# Patient Record
Sex: Male | Born: 1945 | Race: White | Hispanic: No | Marital: Married | Smoking: Never smoker
Health system: Southern US, Community
[De-identification: ages and names within clinical notes are randomized; demographics above are authoritative.]

## PROBLEM LIST (undated history)

## (undated) DIAGNOSIS — J45909 Unspecified asthma, uncomplicated: Secondary | ICD-10-CM

## (undated) DIAGNOSIS — I10 Essential (primary) hypertension: Secondary | ICD-10-CM

## (undated) DIAGNOSIS — C249 Malignant neoplasm of biliary tract, unspecified: Secondary | ICD-10-CM

## (undated) DIAGNOSIS — C221 Intrahepatic bile duct carcinoma: Secondary | ICD-10-CM

---

## 2011-04-02 HISTORY — PX: NASAL POLYP SURGERY: SHX186

## 2014-12-20 HISTORY — PX: BILE DUCT STENT PLACEMENT: SHX1227

## 2015-01-05 ENCOUNTER — Inpatient Hospital Stay (HOSPITAL_COMMUNITY)
Admission: EM | Admit: 2015-01-05 | Discharge: 2015-01-12 | DRG: 871 | Disposition: A | Discharge status: Home Health | Payer: Federal, State, Local not specified - PPO | Attending: Internal Medicine | Admitting: Internal Medicine

## 2015-01-05 ENCOUNTER — Emergency Department (HOSPITAL_COMMUNITY): Payer: Federal, State, Local not specified - PPO

## 2015-01-05 ENCOUNTER — Encounter (HOSPITAL_COMMUNITY): Payer: Self-pay | Admitting: *Deleted

## 2015-01-05 DIAGNOSIS — J45909 Unspecified asthma, uncomplicated: Secondary | ICD-10-CM | POA: Diagnosis present

## 2015-01-05 DIAGNOSIS — D6959 Other secondary thrombocytopenia: Secondary | ICD-10-CM | POA: Diagnosis not present

## 2015-01-05 DIAGNOSIS — T368X5A Adverse effect of other systemic antibiotics, initial encounter: Secondary | ICD-10-CM | POA: Diagnosis not present

## 2015-01-05 DIAGNOSIS — N179 Acute kidney failure, unspecified: Secondary | ICD-10-CM | POA: Diagnosis present

## 2015-01-05 DIAGNOSIS — Z515 Encounter for palliative care: Secondary | ICD-10-CM | POA: Diagnosis not present

## 2015-01-05 DIAGNOSIS — Y92230 Patient room in hospital as the place of occurrence of the external cause: Secondary | ICD-10-CM | POA: Diagnosis not present

## 2015-01-05 DIAGNOSIS — R188 Other ascites: Secondary | ICD-10-CM | POA: Diagnosis present

## 2015-01-05 DIAGNOSIS — A4181 Sepsis due to Enterococcus: Principal | ICD-10-CM | POA: Diagnosis present

## 2015-01-05 DIAGNOSIS — C221 Intrahepatic bile duct carcinoma: Secondary | ICD-10-CM | POA: Diagnosis present

## 2015-01-05 DIAGNOSIS — I10 Essential (primary) hypertension: Secondary | ICD-10-CM | POA: Diagnosis present

## 2015-01-05 DIAGNOSIS — Y95 Nosocomial condition: Secondary | ICD-10-CM | POA: Diagnosis present

## 2015-01-05 DIAGNOSIS — D630 Anemia in neoplastic disease: Secondary | ICD-10-CM | POA: Diagnosis present

## 2015-01-05 DIAGNOSIS — I2699 Other pulmonary embolism without acute cor pulmonale: Secondary | ICD-10-CM | POA: Diagnosis not present

## 2015-01-05 DIAGNOSIS — J9811 Atelectasis: Secondary | ICD-10-CM | POA: Diagnosis present

## 2015-01-05 DIAGNOSIS — D649 Anemia, unspecified: Secondary | ICD-10-CM | POA: Diagnosis present

## 2015-01-05 DIAGNOSIS — Z6826 Body mass index (BMI) 26.0-26.9, adult: Secondary | ICD-10-CM | POA: Diagnosis not present

## 2015-01-05 DIAGNOSIS — R112 Nausea with vomiting, unspecified: Secondary | ICD-10-CM | POA: Diagnosis not present

## 2015-01-05 DIAGNOSIS — Z86711 Personal history of pulmonary embolism: Secondary | ICD-10-CM | POA: Diagnosis not present

## 2015-01-05 DIAGNOSIS — J189 Pneumonia, unspecified organism: Secondary | ICD-10-CM | POA: Diagnosis present

## 2015-01-05 DIAGNOSIS — R7881 Bacteremia: Secondary | ICD-10-CM | POA: Insufficient documentation

## 2015-01-05 DIAGNOSIS — J4520 Mild intermittent asthma, uncomplicated: Secondary | ICD-10-CM | POA: Diagnosis present

## 2015-01-05 DIAGNOSIS — R140 Abdominal distension (gaseous): Secondary | ICD-10-CM

## 2015-01-05 DIAGNOSIS — E43 Unspecified severe protein-calorie malnutrition: Secondary | ICD-10-CM | POA: Diagnosis present

## 2015-01-05 DIAGNOSIS — Z66 Do not resuscitate: Secondary | ICD-10-CM | POA: Diagnosis not present

## 2015-01-05 DIAGNOSIS — K5900 Constipation, unspecified: Secondary | ICD-10-CM | POA: Diagnosis present

## 2015-01-05 DIAGNOSIS — K5909 Other constipation: Secondary | ICD-10-CM | POA: Diagnosis not present

## 2015-01-05 HISTORY — DX: Unspecified asthma, uncomplicated: J45.909

## 2015-01-05 HISTORY — DX: Intrahepatic bile duct carcinoma: C22.1

## 2015-01-05 HISTORY — DX: Essential (primary) hypertension: I10

## 2015-01-05 HISTORY — DX: Malignant neoplasm of biliary tract, unspecified: C24.9

## 2015-01-05 LAB — URINE MICROSCOPIC-ADD ON

## 2015-01-05 LAB — URINALYSIS, ROUTINE W REFLEX MICROSCOPIC
GLUCOSE, UA: NEGATIVE mg/dL
Hgb urine dipstick: NEGATIVE
KETONES UR: NEGATIVE mg/dL
Nitrite: NEGATIVE
PROTEIN: 100 mg/dL — AB
Specific Gravity, Urine: 1.027 (ref 1.005–1.030)
Urobilinogen, UA: 1 mg/dL (ref 0.0–1.0)
pH: 5.5 (ref 5.0–8.0)

## 2015-01-05 LAB — CBC WITH DIFFERENTIAL/PLATELET
Basophils Absolute: 0 10*3/uL (ref 0.0–0.1)
Basophils Relative: 0 %
EOS ABS: 0 10*3/uL (ref 0.0–0.7)
Eosinophils Relative: 0 %
HEMATOCRIT: 28.9 % — AB (ref 39.0–52.0)
HEMOGLOBIN: 9.3 g/dL — AB (ref 13.0–17.0)
LYMPHS ABS: 0.5 10*3/uL — AB (ref 0.7–4.0)
LYMPHS PCT: 3 %
MCH: 29.2 pg (ref 26.0–34.0)
MCHC: 32.2 g/dL (ref 30.0–36.0)
MCV: 90.6 fL (ref 78.0–100.0)
Monocytes Absolute: 0.7 10*3/uL (ref 0.1–1.0)
Monocytes Relative: 4 %
NEUTROS ABS: 15.3 10*3/uL — AB (ref 1.7–7.7)
NEUTROS PCT: 93 %
Platelets: 138 10*3/uL — ABNORMAL LOW (ref 150–400)
RBC: 3.19 MIL/uL — AB (ref 4.22–5.81)
RDW: 15.2 % (ref 11.5–15.5)
WBC: 16.4 10*3/uL — AB (ref 4.0–10.5)

## 2015-01-05 LAB — COMPREHENSIVE METABOLIC PANEL
ALT: 129 U/L — ABNORMAL HIGH (ref 17–63)
ANION GAP: 9 (ref 5–15)
AST: 126 U/L — ABNORMAL HIGH (ref 15–41)
Albumin: 2.4 g/dL — ABNORMAL LOW (ref 3.5–5.0)
Alkaline Phosphatase: 453 U/L — ABNORMAL HIGH (ref 38–126)
BUN: 25 mg/dL — ABNORMAL HIGH (ref 6–20)
CHLORIDE: 107 mmol/L (ref 101–111)
CO2: 19 mmol/L — ABNORMAL LOW (ref 22–32)
Calcium: 7.7 mg/dL — ABNORMAL LOW (ref 8.9–10.3)
Creatinine, Ser: 1.37 mg/dL — ABNORMAL HIGH (ref 0.61–1.24)
GFR, EST AFRICAN AMERICAN: 59 mL/min — AB (ref >60)
GFR, EST NON AFRICAN AMERICAN: 51 mL/min — AB (ref >60)
Glucose, Bld: 136 mg/dL — ABNORMAL HIGH (ref 65–99)
POTASSIUM: 3.9 mmol/L (ref 3.5–5.1)
Sodium: 135 mmol/L (ref 135–145)
Total Bilirubin: 2.1 mg/dL — ABNORMAL HIGH (ref 0.3–1.2)
Total Protein: 6 g/dL — ABNORMAL LOW (ref 6.5–8.1)

## 2015-01-05 LAB — I-STAT CG4 LACTIC ACID, ED
LACTIC ACID, VENOUS: 1.76 mmol/L (ref 0.5–2.0)
Lactic Acid, Venous: 1.01 mmol/L (ref 0.5–2.0)

## 2015-01-05 MED ORDER — DOCUSATE SODIUM 100 MG PO CAPS
100.0000 mg | ORAL_CAPSULE | Freq: Three times a day (TID) | ORAL | Status: DC
  Administered 2015-01-05: 100 mg via ORAL
  Filled 2015-01-05 (×3): qty 1

## 2015-01-05 MED ORDER — FINASTERIDE 1 MG PO TABS: 1.0000 mg | ORAL_TABLET | Freq: Every day | ORAL | Status: DC

## 2015-01-05 MED ORDER — ENOXAPARIN SODIUM 100 MG/ML ~~LOC~~ SOLN
85.0000 mg | Freq: Two times a day (BID) | SUBCUTANEOUS | Status: DC
  Administered 2015-01-05 – 2015-01-12 (×11): 85 mg via SUBCUTANEOUS
  Filled 2015-01-05 (×14): qty 1

## 2015-01-05 MED ORDER — CETYLPYRIDINIUM CHLORIDE 0.05 % MT LIQD
7.0000 mL | Freq: Two times a day (BID) | OROMUCOSAL | Status: DC
  Administered 2015-01-06 – 2015-01-09 (×6): 7 mL via OROMUCOSAL

## 2015-01-05 MED ORDER — MONTELUKAST SODIUM 10 MG PO TABS
10.0000 mg | ORAL_TABLET | Freq: Every day | ORAL | Status: DC
  Administered 2015-01-05 – 2015-01-12 (×8): 10 mg via ORAL
  Filled 2015-01-05 (×8): qty 1

## 2015-01-05 MED ORDER — IPRATROPIUM-ALBUTEROL 0.5-2.5 (3) MG/3ML IN SOLN: 3.0000 mL | Freq: Four times a day (QID) | RESPIRATORY_TRACT | Status: DC | PRN

## 2015-01-05 MED ORDER — MOMETASONE FURO-FORMOTEROL FUM 200-5 MCG/ACT IN AERO
2.0000 | INHALATION_SPRAY | Freq: Two times a day (BID) | RESPIRATORY_TRACT | Status: DC
  Filled 2015-01-05: qty 8.8

## 2015-01-05 MED ORDER — CHLORHEXIDINE GLUCONATE 0.12 % MT SOLN
15.0000 mL | Freq: Two times a day (BID) | OROMUCOSAL | Status: DC
  Administered 2015-01-05 – 2015-01-11 (×8): 15 mL via OROMUCOSAL
  Filled 2015-01-05 (×14): qty 15

## 2015-01-05 MED ORDER — SODIUM CHLORIDE 0.9 % IV BOLUS (SEPSIS)
1000.0000 mL | Freq: Once | INTRAVENOUS | Status: AC
  Administered 2015-01-05: 1000 mL via INTRAVENOUS

## 2015-01-05 MED ORDER — ONDANSETRON HCL 4 MG/2ML IJ SOLN
4.0000 mg | Freq: Four times a day (QID) | INTRAMUSCULAR | Status: DC | PRN
  Administered 2015-01-11: 4 mg via INTRAVENOUS
  Filled 2015-01-05: qty 2

## 2015-01-05 MED ORDER — CLOTRIMAZOLE 1 % EX CREA
1.0000 "application " | TOPICAL_CREAM | Freq: Two times a day (BID) | CUTANEOUS | Status: DC
  Administered 2015-01-05 – 2015-01-11 (×10): 1 via TOPICAL
  Filled 2015-01-05: qty 15

## 2015-01-05 MED ORDER — ONDANSETRON HCL 4 MG PO TABS: 4.0000 mg | ORAL_TABLET | Freq: Four times a day (QID) | ORAL | Status: DC | PRN

## 2015-01-05 MED ORDER — VANCOMYCIN HCL IN DEXTROSE 1-5 GM/200ML-% IV SOLN
1000.0000 mg | Freq: Once | INTRAVENOUS | Status: AC
  Administered 2015-01-05: 1000 mg via INTRAVENOUS
  Filled 2015-01-05: qty 200

## 2015-01-05 MED ORDER — VANCOMYCIN HCL IN DEXTROSE 750-5 MG/150ML-% IV SOLN
750.0000 mg | Freq: Two times a day (BID) | INTRAVENOUS | Status: DC
  Administered 2015-01-06 – 2015-01-08 (×5): 750 mg via INTRAVENOUS
  Filled 2015-01-05 (×6): qty 150

## 2015-01-05 MED ORDER — PIPERACILLIN-TAZOBACTAM 3.375 G IVPB
3.3750 g | Freq: Three times a day (TID) | INTRAVENOUS | Status: DC
  Administered 2015-01-05 – 2015-01-08 (×9): 3.375 g via INTRAVENOUS
  Filled 2015-01-05 (×12): qty 50

## 2015-01-05 MED ORDER — SENNA 8.6 MG PO TABS
2.0000 | ORAL_TABLET | Freq: Every evening | ORAL | Status: DC
  Administered 2015-01-05: 17.2 mg via ORAL
  Filled 2015-01-05 (×2): qty 2

## 2015-01-05 MED ORDER — SODIUM CHLORIDE 0.9 % IJ SOLN
3.0000 mL | Freq: Two times a day (BID) | INTRAMUSCULAR | Status: DC
  Administered 2015-01-05 – 2015-01-11 (×5): 3 mL via INTRAVENOUS

## 2015-01-05 NOTE — ED Notes (Signed)
Bed: ZD63 Expected date:  Expected time:  Means of arrival:  Comments: EMS/61M/f/c/nausea

## 2015-01-05 NOTE — H&P (Signed)
[facility] History and Physical  Kaniel Kiang GQQ:[id number] DOB: Oct 18, 1945 DOA: 01/05/2015  Referring physician: Dorie Rank, MD PCP: Cari Caraway, MD   Chief Complaint: Fever and chills for 2 days  HPI: Terrance Larsen is a 69 y.o. male with a past medical history of asthma, hypertension, bilateral pulmonary embolism, recently diagnosed cholangiocarcinoma who comes to the emergency department with complaints of fever and chills for the last 2 days. The patient denies productive cough, but has increased shortness of breath from baseline. He complains of wheezing, but denies earaches, sore throat or rhinorrhea. He denies any known sick contacts, but has been diagnosed and treated at [facility] in [place] and at [place]. He is supposed to start chemotherapy on Monday.  Patient also complains of severe constipation for the past 3 weeks, which has produced abdominal distention and is impairing his breathing. He has occasionally mild nausea, but denies emesis.   Review of Systems:  Constitutional:  Last 2 days positive for Fever, chills, fatigue.  Positive weight loss, night sweats. HEENT:  No headaches, Difficulty swallowing,Tooth/dental problems,Sore throat,  No sneezing, itching, ear ache, nasal congestion, post nasal drip,  Cardio-vascular:  No chest pain, Orthopnea, PND, swelling in lower extremities, anasarca, dizziness, palpitations  GI:  Positive abdominal pain, nausea, change in bowel habits, loss of appetite.  No heartburn, indigestion, , vomiting, diarrhea Resp:  Positive shortness of breath with exertion or at rest. No excess mucus,  positive non-productive cough, wheezing. No hemoptysis.No change in color of mucus..No chest wall deformity  Skin:  no rash or lesions.  GU:  no dysuria, change in color of urine, no urgency or frequency. No flank pain.  Musculoskeletal:  No joint pain or swelling. No decreased range of motion. No back pain.  Psych:    No change in mood or affect. No depression or anxiety. No memory loss.   Past Medical History  Diagnosis Date  . 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 Hypertension   . Asthma    Past Surgical History  Procedure Laterality Date  . Nasal polyp surgery Bilateral 2013  . Bile duct stent placement  12/20/2014    Liver biopsy was done at this time as well.   Social History:  reports that he has never smoked. He does not have any smokeless tobacco history on file. He reports that he does not drink alcohol or use illicit drugs.  Allergies  Allergen Reactions  . Amlodipine Swelling    'ankles'  . Hctz [Hydrochlorothiazide] Rash    History reviewed. No pertinent family history.    Prior to Admission medications   Medication Sig Start Date End Date Taking? Authorizing Provider  clotrimazole (LOTRIMIN) 1 % cream Apply 1 application topically 2 (two) times daily.   Yes Historical Provider, MD  dexamethasone (DECADRON) 4 MG tablet Take 8 mg by mouth daily as needed. Take for three days after chemo 01/03/15  Yes Historical Provider, MD  docusate sodium (COLACE) 100 MG capsule Take 100 mg by mouth 3 (three) times daily.   Yes Historical Provider, MD  DULERA 200-5 MCG/ACT AERO Inhale 2 puffs into the lungs 2 (two) times daily.  12/06/14  Yes Historical Provider, MD  enoxaparin (LOVENOX) 100 MG/ML injection Inject 0.85 mLs into the skin 2 (two) times daily. For thirty days start 9/19 12/19/14  Yes Historical Provider, MD  finasteride (PROPECIA) 1 MG tablet Take 1 mg by mouth daily.   Yes Historical Provider, MD  omalizumab Arvid Right) 150 MG  injection Inject into the skin every 28 (twenty-eight) days.    Yes Historical Provider, MD  ondansetron (ZOFRAN) 8 MG tablet Take 8 mg by mouth every 8 (eight) hours as needed. For nausea from chemo for three days after treatment 01/03/15  Yes Historical Provider, MD  [place] Starting Chemo 01/09/15 at [facility] .   Yes  Historical Provider, MD  SENNA LAX 8.6 MG tablet Take 2 tablets by mouth every evening. 12/21/14  Yes Historical Provider, MD  montelukast (SINGULAIR) 10 MG tablet Take 10 mg by mouth daily.    Historical Provider, MD   Physical Exam: Filed Vitals:   01/05/15 1800 01/05/15 1824 01/05/15 1900 01/05/15 1907  BP: 113/69 113/70 127/75   Pulse: 99 100 104   Temp:  101.3 F (38.5 C)    TempSrc:  Oral    Resp: 27 24 25    Height:    5\' 10"  (1.778 m)  Weight:    82.101 kg (181 lb)  SpO2: 97% 96% 98%     Wt Readings from Last 3 Encounters:  01/05/15 82.101 kg (181 lb)    General:  Appears calm and comfortable. Eyes: PERRL, normal lids, irises & conjunctiva ENT: grossly normal hearing, lips & tongue are dry Neck: no LAD, masses or thyromegaly Cardiovascular: Sinus tachycardia, no m/r/g. No LE edema. Telemetry: Sinus tachycardia at 106 bpm Respiratory: Right lower lobe Rales, mildly tachypneic at 24 breaths per minute. Abdomen: Distended, bowel sounds positive, soft, right and left lower quadrant tenderness without guarding or rebound tenderness. Skin: In general rash. Musculoskeletal: grossly normal tone BUE/BLE Psychiatric: grossly normal mood and affect, speech fluent and appropriate Neurologic: grossly non-focal.          Labs on Admission:  Basic Metabolic Panel:  Recent Labs Lab 01/05/15 1717  NA 135  K 3.9  CL 107  CO2 19*  GLUCOSE 136*  BUN 25*  CREATININE 1.37*  CALCIUM 7.7*   Liver Function Tests:  Recent Labs Lab 01/05/15 1717  AST 126*  ALT 129*  ALKPHOS 453*  BILITOT 2.1*  PROT 6.0*  ALBUMIN 2.4*   CBC:  Recent Labs Lab 01/05/15 1717  WBC 16.4*  NEUTROABS 15.3*  HGB 9.3*  HCT 28.9*  MCV 90.6  PLT 138*    Radiological Exams on Admission: Dg Chest 2 View  01/05/2015   CLINICAL DATA:  Dry cough and fever for 2 days. Recently diagnosed with cholangiocarcinoma.  EXAM: CHEST  2 VIEW  COMPARISON:  None.  FINDINGS: Study is somewhat  hypoinspiratory with crowding of the perihilar and lower lobe bronchovascular markings. Streaky opacity within the right lower lobe is of uncertain etiology, probably atelectasis but possibly an early developing pneumonia.  No pleural effusion seen. No pneumothorax. Heart size is normal. No significant osseous abnormality seen.  IMPRESSION: 1. Streaky opacity within the right lower lung is of uncertain etiology or significance, probably atelectasis but early developing pneumonia cannot be excluded. Recommend follow-up chest x-ray in 2-4 weeks to ensure resolution. 2. Otherwise unremarkable chest x-ray, as detailed above.   Electronically Signed   By: Franki Cabot M.D.   On: 01/05/2015 17:37   Dg Abd 1 View  01/05/2015   CLINICAL DATA:  Pt complains of dry cough and fever x 2 days. Pt was recently diagnosed with cholangiocarcinoma. Hx of HTN. Pt also complains of constipation and abdominal distension x 2 weeks.  EXAM: ABDOMEN - 1 VIEW  COMPARISON:  None.  FINDINGS: Plastic biliary stent projects in  expected location. Retrievable IVC filter at the L2-3 level. Multiple gas filled nondilated small bowel loops in the mid abdomen. The colon and rectum are nondilated.  No abnormal abdominal calcifications.  Subchondral sclerosis in the right acetabulum.  IMPRESSION: 1. Mild small bowel gaseous distension without evidence of obstruction. 2. Biliary stent an IVC filter.   Electronically Signed   By: Lucrezia Europe M.D.   On: 01/05/2015 17:37    Assessment/Plan Principal Problem:     HCAP (healthcare-associated pneumonia) Admit to telemetry for cardiac monitoring. Continue supplemental oxygen. Continue IV vancomycin and Zosyn. Since the patient is already on Lovenox, monitor platelets closely while on Zosyn. DuoNeb as needed. Follow-up blood cultures and sensitivity. Obtain legionella and strep pneumonia urine antigens. Check sputum Gram stain, culture and sensitivity.  Active Problems:     Anemia Monitor H&H  closely.      Bilateral pulmonary embolism (HCC) Continue Lovenox subcutaneously 1 mg/kg twice a day.      Cholangiocarcinoma [facility]) The patient most likely will need to be rescheduled for his chemotherapy that was supposed to be started on Monday.       Asthma Continue current home medications. DuoNeb as needed.      Hypertension Not on any antihypertensives at the moment.  Monitor blood pressure      Constipation The patient has tried multiple oral medications without any significant results. Agreed to try to tap water enema.   Code Status: Full code. DVT Prophylaxis: On full dose Lovenox. Family CommunicationTennis, [place] [phone]  [phone]  Disposition Plan: Admit for IV antibiotic therapy.  Time spent: 70 minutes were spent during the process of this admission.  Reubin Milan [facility] Pager [phone].

## 2015-01-05 NOTE — ED Notes (Signed)
Per EMS, pt from home, recently dx with cholangeo carcinoma x 2 weeks ago.  Reports fever and chills and nausea x 2 days.  Starts chemo Monday.  Pt is A&O x4.

## 2015-01-05 NOTE — Progress Notes (Signed)
ANTIBIOTIC CONSULT NOTE - INITIAL  Pharmacy Consult for Vancomycin Indication: pneumonia  Allergies  Allergen Reactions  . Amlodipine Swelling    'ankles'  . Hctz [Hydrochlorothiazide] Rash    Patient Measurements:     Vital Signs: Temp: 101.3 F (38.5 C) (10/06 1824) Temp Source: Oral (10/06 1824) BP: 113/70 mmHg (10/06 1824) Pulse Rate: 100 (10/06 1824) Intake/Output from previous day:   Intake/Output from this shift:    Labs:  Recent Labs  01/05/15 1717  WBC 16.4*  HGB 9.3*  PLT 138*  CREATININE 1.37*   CrCl cannot be calculated (Unknown ideal weight.). No results for input(s): VANCOTROUGH, VANCOPEAK, VANCORANDOM, GENTTROUGH, GENTPEAK, GENTRANDOM, TOBRATROUGH, TOBRAPEAK, TOBRARND, AMIKACINPEAK, AMIKACINTROU, AMIKACIN in the last 72 hours.   Microbiology: No results found for this or any previous visit (from the past 720 hour(s)).  Medical History: Past Medical History  Diagnosis Date  . 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 Hypertension   . Asthma      Assessment: 55 yoM recently diagnosed with cholangiocarcinoma presents with recurrent fevers.  CXR shows possibility of pneumonia.  Vancomycin and Zosyn started.  Vancomycin 1g x 1 ordered while waiting for patient weight.  Blood and urine cultures ordered.  Tmax 101.3 WBC 16.4 SCr 1.37, CrCl~58 ml/min  Goal of Therapy:  Vancomycin trough level 15-20 mcg/ml  Doses adjusted per renal function Eradication of infection  Plan:  1.  Vancomycin 750 mg IV q12h. 2.  Continue Zosyn 3.375g IV q8h (4 hour infusion time).   Terrance Larsen 01/05/2015,7:02 PM

## 2015-01-05 NOTE — ED Notes (Signed)
Hospitalist at the bedside 

## 2015-01-05 NOTE — ED Provider Notes (Signed)
CSN: [id number]     Arrival date & time 01/05/15  1605 History   First MD Initiated Contact with Patient 01/05/15 1614     Chief Complaint  Patient presents with  . Fever  . Nausea    HPI Comments: Pt was diagnosed with cholangiocarcinoma 2 weeks ago.  Initially he had a DVT.  Pt developed jaundice and that is how it was diagnosed.  He has an IVC filter and a biliary drain.   This was at [facility]).  Patient is a 69 y.o. male presenting with fever. The history is provided by the patient.  Fever Max temp prior to arrival:  101 Severity:  Moderate Timing:  Intermittent Chronicity:  New Associated symptoms: no cough, no dysuria and no vomiting   Associated symptoms comment:  Pt has been consitpated.  He has not had a normal bowel movement in a couple of weeks.  He did have a small BM yesterday. Risk factors: hx of cancer     Past Medical History  Diagnosis Date  . 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 Hypertension   . Asthma    History reviewed. No pertinent past surgical history. No family history on file. Social History  Substance Use Topics  . Smoking status: Never Smoker   . Smokeless tobacco: None  . Alcohol Use: No    Review of Systems  Constitutional: Positive for fever.  Respiratory: Negative for cough.   Gastrointestinal: Negative for vomiting.  Genitourinary: Negative for dysuria.  All other systems reviewed and are negative.     Allergies  Hctz  Home Medications   Prior to Admission medications   Not on File   BP 143/84 mmHg  Pulse 118  Temp(Src) 99 F (37.2 C)  Resp 17  SpO2 97% Physical Exam  Constitutional: No distress.  HENT:  Head: Normocephalic and atraumatic.  Right Ear: External ear normal.  Left Ear: External ear normal.  Eyes: Conjunctivae are normal. Right eye exhibits no discharge. Left eye exhibits no discharge. No scleral icterus.  Neck: Neck supple. No tracheal deviation present.  Cardiovascular:  Normal rate, regular rhythm and intact distal pulses.   Pulmonary/Chest: Effort normal and breath sounds normal. No stridor. No respiratory distress. He has no wheezes. He has no rales.  Abdominal: Soft. Bowel sounds are normal. He exhibits no distension. There is no tenderness. There is no rebound and no guarding.  Musculoskeletal: He exhibits no edema or tenderness.  Neurological: He is alert. He has normal strength. No cranial nerve deficit (no facial droop, extraocular movements intact, no slurred speech) or sensory deficit. He exhibits normal muscle tone. He displays no seizure activity. Coordination normal.  Skin: Skin is warm and dry. No rash noted.  Psychiatric: He has a normal mood and affect.  Nursing note and vitals reviewed.   ED Course  Procedures (including critical care time) Labs Review Labs Reviewed  COMPREHENSIVE METABOLIC PANEL - Abnormal; Notable for the following:    CO2 19 (*)    Glucose, Bld 136 (*)    BUN 25 (*)    Creatinine, Ser 1.37 (*)    Calcium 7.7 (*)    Total Protein 6.0 (*)    Albumin 2.4 (*)    AST 126 (*)    ALT 129 (*)    Alkaline Phosphatase 453 (*)    Total Bilirubin 2.1 (*)    GFR calc non Af Amer 51 (*)    GFR calc Af Amer 59 (*)  All other components within normal limits  CBC WITH DIFFERENTIAL/PLATELET - Abnormal; Notable for the following:    WBC 16.4 (*)    RBC 3.19 (*)    Hemoglobin 9.3 (*)    HCT 28.9 (*)    Platelets 138 (*)    Neutro Abs 15.3 (*)    Lymphs Abs 0.5 (*)    All other components within normal limits  CULTURE, BLOOD (ROUTINE X 2)  CULTURE, BLOOD (ROUTINE X 2)  URINE CULTURE  URINALYSIS, ROUTINE W REFLEX MICROSCOPIC (NOT AT [facility])  I-STAT CG4 LACTIC ACID, ED    Imaging Review Dg Chest 2 View  01/05/2015   CLINICAL DATA:  Dry cough and fever for 2 days. Recently diagnosed with cholangiocarcinoma.  EXAM: CHEST  2 VIEW  COMPARISON:  None.  FINDINGS: Study is somewhat hypoinspiratory with crowding of the perihilar  and lower lobe bronchovascular markings. Streaky opacity within the right lower lobe is of uncertain etiology, probably atelectasis but possibly an early developing pneumonia.  No pleural effusion seen. No pneumothorax. Heart size is normal. No significant osseous abnormality seen.  IMPRESSION: 1. Streaky opacity within the right lower lung is of uncertain etiology or significance, probably atelectasis but early developing pneumonia cannot be excluded. Recommend follow-up chest x-ray in 2-4 weeks to ensure resolution. 2. Otherwise unremarkable chest x-ray, as detailed above.   Electronically Signed   By: Franki Cabot M.D.   On: 01/05/2015 17:37   Dg Abd 1 View  01/05/2015   CLINICAL DATA:  Pt complains of dry cough and fever x 2 days. Pt was recently diagnosed with cholangiocarcinoma. Hx of HTN. Pt also complains of constipation and abdominal distension x 2 weeks.  EXAM: ABDOMEN - 1 VIEW  COMPARISON:  None.  FINDINGS: Plastic biliary stent projects in expected location. Retrievable IVC filter at the L2-3 level. Multiple gas filled nondilated small bowel loops in the mid abdomen. The colon and rectum are nondilated.  No abnormal abdominal calcifications.  Subchondral sclerosis in the right acetabulum.  IMPRESSION: 1. Mild small bowel gaseous distension without evidence of obstruction. 2. Biliary stent an IVC filter.   Electronically Signed   By: Lucrezia Europe M.D.   On: 01/05/2015 17:37   I have personally reviewed and evaluated these images and lab results as part of my medical decision-making.   MDM   Final diagnoses:  HCAP (healthcare-associated pneumonia)  Cholangiocarcinoma [facility])   Patient presents to the emergency room with recurrent fevers. Patient was recently diagnosed with cholangiocarcinoma. He was in the hospital at a medical facility in [place]. The patient is planning on receiving treatment at [facility].    Patient is having fevers up to 101 here in the emergency department.  Laboratory tests show leukocytosis as well as persistent elevations in his bilirubin and increase in his BUN and creatinine. Do not have any old labs for comparison. Chest x-ray suggests the possibility of pneumonia.  Start the patient on antibiotics to cover for healthcare associated pneumonia. I will consult the medical service for admission and further treatment.     Dorie Rank, MD 01/05/15 Lurline Hare

## 2015-01-05 NOTE — Progress Notes (Signed)
Patient listed as not having insurance or a pcp.  EDCM spoke to patient at bedside.  Patient confirms he has insurance.  Patient reports he has a new pcp Dr. Theadore Nan with [facility], but he has not seen her yet.  System updated.

## 2015-01-06 DIAGNOSIS — I10 Essential (primary) hypertension: Secondary | ICD-10-CM

## 2015-01-06 DIAGNOSIS — R6510 Systemic inflammatory response syndrome (SIRS) of non-infectious origin without acute organ dysfunction: Secondary | ICD-10-CM

## 2015-01-06 DIAGNOSIS — E43 Unspecified severe protein-calorie malnutrition: Secondary | ICD-10-CM

## 2015-01-06 DIAGNOSIS — J4520 Mild intermittent asthma, uncomplicated: Secondary | ICD-10-CM

## 2015-01-06 LAB — COMPREHENSIVE METABOLIC PANEL
ALBUMIN: 2.1 g/dL — AB (ref 3.5–5.0)
ALT: 116 U/L — ABNORMAL HIGH (ref 17–63)
ANION GAP: 6 (ref 5–15)
AST: 114 U/L — AB (ref 15–41)
Alkaline Phosphatase: 368 U/L — ABNORMAL HIGH (ref 38–126)
BUN: 24 mg/dL — AB (ref 6–20)
CHLORIDE: 109 mmol/L (ref 101–111)
CO2: 21 mmol/L — ABNORMAL LOW (ref 22–32)
Calcium: 7.7 mg/dL — ABNORMAL LOW (ref 8.9–10.3)
Creatinine, Ser: 1.39 mg/dL — ABNORMAL HIGH (ref 0.61–1.24)
GFR calc Af Amer: 58 mL/min — ABNORMAL LOW (ref >60)
GFR, EST NON AFRICAN AMERICAN: 50 mL/min — AB (ref >60)
GLUCOSE: 117 mg/dL — AB (ref 65–99)
POTASSIUM: 3.7 mmol/L (ref 3.5–5.1)
Sodium: 136 mmol/L (ref 135–145)
Total Bilirubin: 2 mg/dL — ABNORMAL HIGH (ref 0.3–1.2)
Total Protein: 5.7 g/dL — ABNORMAL LOW (ref 6.5–8.1)

## 2015-01-06 LAB — CBC WITH DIFFERENTIAL/PLATELET
BASOS ABS: 0 10*3/uL (ref 0.0–0.1)
Basophils Relative: 0 %
EOS PCT: 0 %
Eosinophils Absolute: 0 10*3/uL (ref 0.0–0.7)
HEMATOCRIT: 26 % — AB (ref 39.0–52.0)
HEMOGLOBIN: 8.3 g/dL — AB (ref 13.0–17.0)
LYMPHS ABS: 0.9 10*3/uL (ref 0.7–4.0)
LYMPHS PCT: 7 %
MCH: 28.9 pg (ref 26.0–34.0)
MCHC: 31.9 g/dL (ref 30.0–36.0)
MCV: 90.6 fL (ref 78.0–100.0)
Monocytes Absolute: 1.7 10*3/uL — ABNORMAL HIGH (ref 0.1–1.0)
Monocytes Relative: 14 %
NEUTROS ABS: 9.8 10*3/uL — AB (ref 1.7–7.7)
Neutrophils Relative %: 78 %
PLATELETS: 120 10*3/uL — AB (ref 150–400)
RBC: 2.87 MIL/uL — AB (ref 4.22–5.81)
RDW: 15.1 % (ref 11.5–15.5)
WBC: 12.5 10*3/uL — AB (ref 4.0–10.5)

## 2015-01-06 LAB — STREP PNEUMONIAE URINARY ANTIGEN: STREP PNEUMO URINARY ANTIGEN: NEGATIVE

## 2015-01-06 LAB — URINE CULTURE: Culture: NO GROWTH

## 2015-01-06 MED ORDER — POLYETHYLENE GLYCOL 3350 17 G PO PACK
17.0000 g | PACK | Freq: Two times a day (BID) | ORAL | Status: DC
  Administered 2015-01-06: 17 g via ORAL
  Filled 2015-01-06 (×6): qty 1

## 2015-01-06 MED ORDER — SODIUM CHLORIDE 0.9 % IV SOLN
INTRAVENOUS | Status: DC
  Administered 2015-01-06: 23:00:00 via INTRAVENOUS

## 2015-01-06 MED ORDER — BUDESONIDE 0.25 MG/2ML IN SUSP
0.2500 mg | Freq: Two times a day (BID) | RESPIRATORY_TRACT | Status: DC
  Administered 2015-01-06 – 2015-01-12 (×13): 0.25 mg via RESPIRATORY_TRACT
  Filled 2015-01-06 (×13): qty 2

## 2015-01-06 MED ORDER — DOCUSATE SODIUM 100 MG PO CAPS
100.0000 mg | ORAL_CAPSULE | Freq: Two times a day (BID) | ORAL | Status: DC
  Administered 2015-01-06 (×2): 100 mg via ORAL
  Filled 2015-01-06 (×2): qty 1

## 2015-01-06 NOTE — Care Management Note (Signed)
Case Management Note  Patient Details  Name: Terrance Larsen MRN: [id number] Date of Birth: 1945-05-09  Subjective/Objective: 69 y.o. M admitted with recurrent fevers following recent diagnosis of Cholangiocarcinoma. Fever, Chills, T 101.3. Lives with spouse in Private residence. Currently on IV Abx.                    Action/Plan:Will continue to follow for Disposition and any Discharge needs.   Expected Discharge Date:   (unknown)               Expected Discharge Plan:  [place]  In-House Referral:     Discharge planning Services  CM Consult  Post Acute Care Choice:    Choice offered to:     DME Arranged:    DME Agency:     HH Arranged:    HH Agency:     Status of Service:  In process, will continue to follow  Medicare Important Message Given:    Date Medicare IM Given:    Medicare IM give by:    Date Additional Medicare IM Given:    Additional Medicare Important Message give by:     If discussed at [place] of Stay Meetings, dates discussed:    Additional Comments:  Delrae Sawyers, RN 01/06/2015, 2:02 PM

## 2015-01-06 NOTE — Progress Notes (Signed)
Lab reported second set of blood cultures grew gram negative rods. MD notified on floor. Already on antibiotic coverage.  Kathryne Sharper, RN

## 2015-01-06 NOTE — Progress Notes (Signed)
Initial Nutrition Assessment  DOCUMENTATION CODES:   Severe malnutrition in context of acute illness/injury  INTERVENTION:  - Recommended to pt to drink fluids between meals, not with meals - RD will continue to monitor for needs, including needs for supplements  NUTRITION DIAGNOSIS:   Inadequate oral intake related to acute illness, altered GI function as evidenced by per patient/family report, meal completion < 25%.  GOAL:   Patient will meet greater than or equal to 90% of their needs  MONITOR:   PO intake, Weight trends, Labs, I & O's  REASON FOR ASSESSMENT:   Malnutrition Screening Tool  ASSESSMENT:   69 y.o. male with a past medical history of asthma, hypertension, bilateral pulmonary embolism, recently diagnosed cholangiocarcinoma who comes to the emergency department with complaints of fever and chills for the last 2 days. The patient denies productive cough, but has increased shortness of breath from baseline. He complains of wheezing, but denies earaches, sore throat or rhinorrhea. He denies any known sick contacts, but has been diagnosed and treated at [facility] in [place] and at [place]. He is supposed to start chemotherapy on Monday. (01/09/15)  Pt seen for MST. BMI indicates over weight status. Pt reports that for the past 3 weeks he has felt very constipated and very full. Due to this, he has only been able to take a few bites at meals. Even these few bites make this full feeling more pronounced but does not cause nausea. He states that it is not the type of food, but the quantity, that causes this sensation.  Noted that pt had have consumed iced tea on lunch tray. Pt states that he often takes several sips of fluids with each bite of food. Encouraged him to drink fluids between meals rather than with meals to encourage greater intakes of solid foods. Pt states that this makes sense to him and he will try to do this with subsequent meals.  Physical  assessment not performed during this assessment but will be done on follow-up. Pt reports 10-15 lb weight loss over the past 3 weeks; he states no weight loss prior to this time frame. This indicates 5-7.5% body weight in 3 weeks which is significant for time frame.  Not meeting needs. Will continue to monitor for needs and recommendations. Medications reviewed. Labs reviewed; BUN/creatinine elevated, Ca: 7.7 mg/dL, GFR: 50.   Diet Order:  Diet Heart Room service appropriate?: Yes; Fluid consistency:: Thin  Skin:  Reviewed, no issues  Last BM:  10/6  Height:   Ht Readings from Last 1 Encounters:  01/05/15 5\' 10"  (1.778 m)    Weight:   Wt Readings from Last 1 Encounters:  01/05/15 185 lb 3 oz (84 kg)    Ideal Body Weight:  75.45 kg (kg)  BMI:  Body mass index is 26.57 kg/(m^2).  Estimated Nutritional Needs:   Kcal:  2350-2500  Protein:  85-95 grams  Fluid:  1.8-2 L/day  EDUCATION NEEDS:   No education needs identified at this time     Jarome Matin, RD, LDN Inpatient Clinical Dietitian Pager # [phone] After hours/weekend pager # [phone]

## 2015-01-06 NOTE — Progress Notes (Addendum)
[facility] PROGRESS NOTE  Terrance Larsen NFA:[id number] DOB: Aug 27, 1945 DOA: 01/05/2015 PCP: Cari Caraway, MD  Assessment/Plan: 1-SIRS/early sepsis: due to Gram neg rods bacteremia vs HCAP -patient on presentation with RR of 27, HR 107, temp 101.9 and WBC's 16.4 -positive blood cx's for gram neg rods 2/2 -CXR with concerns for early PNA -patient has been started on broad spectrum antibiotics -will follow cultures results -continue supportive care -follow clinical response  -will start flutter valve  2-hx of bilateral PE -continue lovenox -follow Hgb trend  3-cholangiocarcinoma: s/p biliary drain -plan is for initiation of chemotherapy at [facility] after current infection is clear -discussed with patient's primary oncologist -will monitor LLFT's  4-hx of asthma: mild intermittent and no complicated -will continue nebulizer treatment -no wheezing currently  5-hx of HTN: diet controlled -BP is stable -will monitor  6-constipation: will use miralax and colace -follow response and use PRN enema if needed  7-AKI: no prior records for comparison -assume secondary to pre-renal azotemia -will give IVF's resuscitation -follow renal function -no signs of UTI on UA  8-severe protein calorie malnutrition: in setting of acute illness/malignancy -will follow nutritional service recommendations for feeding supplements   Code Status: Full code Family Communication: wife at bedside Disposition Plan: remains inpatient, continue IV antibiotics, follow cultures speciation and sensitivity.    Consultants:  Discussed with his primary Oncologist at [facility].  Procedures:  See below for x-ray reports   Antibiotics:  Vanc/zosyn 10/06  HPI/Subjective: Positive low grade fever, no CP. Patient reports no SOB and endorses he is feeling better. Positive gram neg rods blood cx's times 2  Objective: Filed Vitals:   01/06/15 2036  BP: 111/73  Pulse: 93  Temp: 98.9 F (37.2  C)  Resp: 16    Intake/Output Summary (Last 24 hours) at 01/06/15 2237 Last data filed at 01/06/15 2229  Gross per 24 hour  Intake   1040 ml  Output      0 ml  Net   1040 ml   Filed Weights   01/05/15 1907 01/05/15 2150  Weight: 82.101 kg (181 lb) 84 kg (185 lb 3 oz)    Exam:   General:  Low grade temp, no CP, breathing better and denying abd pain, nausea or vomiting. No icterus on exam.  Cardiovascular: S1 and S2, no rubs, no gallops, no JVD  Respiratory: scattered rhonchi, mild exp wheezing, no rales  Abdomen: soft, no guarding, positive BS  Musculoskeletal: no edema, no cyanosis  Data Reviewed: Basic Metabolic Panel:  Recent Labs Lab 01/05/15 1717 01/06/15 0555  NA 135 136  K 3.9 3.7  CL 107 109  CO2 19* 21*  GLUCOSE 136* 117*  BUN 25* 24*  CREATININE 1.37* 1.39*  CALCIUM 7.7* 7.7*   Liver Function Tests:  Recent Labs Lab 01/05/15 1717 01/06/15 0555  AST 126* 114*  ALT 129* 116*  ALKPHOS 453* 368*  BILITOT 2.1* 2.0*  PROT 6.0* 5.7*  ALBUMIN 2.4* 2.1*   CBC:  Recent Labs Lab 01/05/15 1717 01/06/15 0555  WBC 16.4* 12.5*  NEUTROABS 15.3* 9.8*  HGB 9.3* 8.3*  HCT 28.9* 26.0*  MCV 90.6 90.6  PLT 138* 120*   BNP (last 3 results) No results for input(s): BNP in the last 8760 hours.  ProBNP (last 3 results) No results for input(s): PROBNP in the last 8760 hours.  CBG: No results for input(s): GLUCAP in the last 168 hours.  Recent Results (from the past 240 hour(s))  Blood Culture (routine x 2)  Status: None (Preliminary result)   Collection Time: 01/05/15  5:05 PM  Result Value Ref Range Status   Specimen Description BLOOD LEFT ANTECUBITAL  Final   Special Requests BOTTLES DRAWN AEROBIC AND ANAEROBIC 5CC  Final   Culture  Setup Time   Final    GRAM NEGATIVE RODS IN BOTH AEROBIC AND ANAEROBIC BOTTLES CRITICAL RESULT CALLED TO, READ BACK BY AND VERIFIED WITH: LIRA @0635  01/06/15 MKELLY    Culture   Final    NO GROWTH < 24  HOURS Performed at [facility]    Report Status PENDING  Incomplete  Blood Culture (routine x 2)     Status: None (Preliminary result)   Collection Time: 01/05/15  5:48 PM  Result Value Ref Range Status   Specimen Description BLOOD LEFT FOREARM  Final   Special Requests BOTTLES DRAWN AEROBIC AND ANAEROBIC 5CC  Final   Culture  Setup Time   Final    GRAM NEGATIVE RODS IN BOTH AEROBIC AND ANAEROBIC BOTTLES CRITICAL RESULT CALLED TO, READ BACK BY AND VERIFIED WITH: K PHILLIPS,RN AT 0946 01/06/15 BY L BENFIELD    Culture   Final    GRAM NEGATIVE RODS Performed at [facility]    Report Status PENDING  Incomplete  Urine culture     Status: None   Collection Time: 01/05/15  7:02 PM  Result Value Ref Range Status   Specimen Description URINE, CLEAN CATCH  Final   Special Requests NONE  Final   Culture   Final    NO GROWTH 1 DAY Performed at [facility]    Report Status 01/06/2015 FINAL  Final     Studies: Dg Chest 2 View  01/05/2015   CLINICAL DATA:  Dry cough and fever for 2 days. Recently diagnosed with cholangiocarcinoma.  EXAM: CHEST  2 VIEW  COMPARISON:  None.  FINDINGS: Study is somewhat hypoinspiratory with crowding of the perihilar and lower lobe bronchovascular markings. Streaky opacity within the right lower lobe is of uncertain etiology, probably atelectasis but possibly an early developing pneumonia.  No pleural effusion seen. No pneumothorax. Heart size is normal. No significant osseous abnormality seen.  IMPRESSION: 1. Streaky opacity within the right lower lung is of uncertain etiology or significance, probably atelectasis but early developing pneumonia cannot be excluded. Recommend follow-up chest x-ray in 2-4 weeks to ensure resolution. 2. Otherwise unremarkable chest x-ray, as detailed above.   Electronically Signed   By: Franki Cabot M.D.   On: 01/05/2015 17:37   Dg Abd 1 View  01/05/2015   CLINICAL DATA:  Pt complains of dry cough and fever x 2  days. Pt was recently diagnosed with cholangiocarcinoma. Hx of HTN. Pt also complains of constipation and abdominal distension x 2 weeks.  EXAM: ABDOMEN - 1 VIEW  COMPARISON:  None.  FINDINGS: Plastic biliary stent projects in expected location. Retrievable IVC filter at the L2-3 level. Multiple gas filled nondilated small bowel loops in the mid abdomen. The colon and rectum are nondilated.  No abnormal abdominal calcifications.  Subchondral sclerosis in the right acetabulum.  IMPRESSION: 1. Mild small bowel gaseous distension without evidence of obstruction. 2. Biliary stent an IVC filter.   Electronically Signed   By: Lucrezia Europe M.D.   On: 01/05/2015 17:37    Scheduled Meds: . antiseptic oral rinse  7 mL Mouth Rinse q12n4p  . budesonide (PULMICORT) nebulizer solution  0.25 mg Nebulization BID  . chlorhexidine  15 mL Mouth Rinse BID  .  clotrimazole  1 application Topical BID  . docusate sodium  100 mg Oral BID  . enoxaparin  85 mg Subcutaneous BID  . finasteride  1 mg Oral Daily  . montelukast  10 mg Oral Daily  . piperacillin-tazobactam  3.375 g Intravenous 3 times per day  . polyethylene glycol  17 g Oral BID  . sodium chloride  3 mL Intravenous Q12H  . vancomycin  750 mg Intravenous Q12H   Continuous Infusions:   Principal Problem:   HCAP (healthcare-associated pneumonia) Active Problems:   Anemia   Bilateral pulmonary embolism ([place])   Cholangiocarcinoma (HCC)   Asthma   Hypertension   Protein-calorie malnutrition, severe    Time spent: 35 minutes    Barton Dubois  [facility] Pager [phone]. If 7PM-7AM, please contact night-coverage at [email or web address], password [facility] 01/06/2015, 10:37 PM  LOS: 1 day

## 2015-01-07 DIAGNOSIS — N179 Acute kidney failure, unspecified: Secondary | ICD-10-CM | POA: Insufficient documentation

## 2015-01-07 DIAGNOSIS — K5909 Other constipation: Secondary | ICD-10-CM

## 2015-01-07 DIAGNOSIS — R7881 Bacteremia: Secondary | ICD-10-CM | POA: Insufficient documentation

## 2015-01-07 DIAGNOSIS — K5900 Constipation, unspecified: Secondary | ICD-10-CM

## 2015-01-07 DIAGNOSIS — Z515 Encounter for palliative care: Secondary | ICD-10-CM

## 2015-01-07 LAB — BASIC METABOLIC PANEL
Anion gap: 11 (ref 5–15)
BUN: 28 mg/dL — ABNORMAL HIGH (ref 6–20)
CALCIUM: 7.6 mg/dL — AB (ref 8.9–10.3)
CHLORIDE: 105 mmol/L (ref 101–111)
CO2: 21 mmol/L — ABNORMAL LOW (ref 22–32)
CREATININE: 1.51 mg/dL — AB (ref 0.61–1.24)
GFR, EST AFRICAN AMERICAN: 53 mL/min — AB (ref >60)
GFR, EST NON AFRICAN AMERICAN: 45 mL/min — AB (ref >60)
Glucose, Bld: 85 mg/dL (ref 65–99)
Potassium: 3.7 mmol/L (ref 3.5–5.1)
SODIUM: 137 mmol/L (ref 135–145)

## 2015-01-07 LAB — LEGIONELLA PNEUMOPHILA SEROGP 1 UR AG: L. PNEUMOPHILA SEROGP 1 UR AG: NEGATIVE

## 2015-01-07 LAB — CBC
HCT: 24.9 % — ABNORMAL LOW (ref 39.0–52.0)
HEMOGLOBIN: 8 g/dL — AB (ref 13.0–17.0)
MCH: 29.2 pg (ref 26.0–34.0)
MCHC: 32.1 g/dL (ref 30.0–36.0)
MCV: 90.9 fL (ref 78.0–100.0)
PLATELETS: 149 10*3/uL — AB (ref 150–400)
RBC: 2.74 MIL/uL — ABNORMAL LOW (ref 4.22–5.81)
RDW: 15.5 % (ref 11.5–15.5)
WBC: 10.2 10*3/uL (ref 4.0–10.5)

## 2015-01-07 LAB — VANCOMYCIN, TROUGH: VANCOMYCIN TR: 17 ug/mL (ref 10.0–20.0)

## 2015-01-07 MED ORDER — SIMETHICONE 40 MG/0.6ML PO SUSP
40.0000 mg | Freq: Four times a day (QID) | ORAL | Status: DC | PRN
  Administered 2015-01-07: 40 mg via ORAL
  Filled 2015-01-07 (×2): qty 0.6

## 2015-01-07 MED ORDER — SENNOSIDES-DOCUSATE SODIUM 8.6-50 MG PO TABS: 1.0000 | ORAL_TABLET | Freq: Two times a day (BID) | ORAL | Status: DC

## 2015-01-07 MED ORDER — METOCLOPRAMIDE HCL 5 MG/ML IJ SOLN
5.0000 mg | Freq: Three times a day (TID) | INTRAMUSCULAR | Status: DC
  Administered 2015-01-07 – 2015-01-08 (×5): 5 mg via INTRAVENOUS
  Filled 2015-01-07 (×3): qty 1
  Filled 2015-01-07: qty 2
  Filled 2015-01-07 (×2): qty 1
  Filled 2015-01-07: qty 2
  Filled 2015-01-07 (×10): qty 1
  Filled 2015-01-07: qty 2

## 2015-01-07 MED ORDER — SODIUM CHLORIDE 0.9 % IV SOLN
INTRAVENOUS | Status: DC
  Administered 2015-01-07 – 2015-01-10 (×5): via INTRAVENOUS

## 2015-01-07 MED ORDER — SORBITOL 70 % SOLN
30.0000 mL | Freq: Two times a day (BID) | Status: DC | PRN
  Administered 2015-01-07: 30 mL via ORAL
  Filled 2015-01-07 (×2): qty 30

## 2015-01-07 MED ORDER — MILK AND MOLASSES ENEMA
1.0000 | Freq: Once | RECTAL | Status: AC
  Administered 2015-01-07: 250 mL via RECTAL
  Filled 2015-01-07: qty 250

## 2015-01-07 MED ORDER — MILK AND MOLASSES ENEMA
1.0000 | Freq: Once | RECTAL | Status: DC
Start: 2015-01-07 — End: 2015-01-07

## 2015-01-07 NOTE — Progress Notes (Signed)
[facility] PROGRESS NOTE  Terrance Larsen YIR:[id number] DOB: 1945-06-10 DOA: 01/05/2015 PCP: Cari Caraway, MD  Assessment/Plan: 1-SIRS/early sepsis: due to Gram neg rods bacteremia vs HCAP -patient on presentation with RR of 27, HR 107, temp 101.9 and WBC's 16.4 -positive blood cx's for gram neg rods 2/2 -CXR with concerns for early PNA, but no coughing and per patient impairment in his breathing due to distended abdomen -will continue broad spectrum antibiotics; looking to discontinue vanc in the next 24-48 hours -will follow final cultures results -continue supportive care -follow clinical response  -will continue flutter valve and pulmonary toiletry -no further fever and WBC's WNL now    2-hx of bilateral PE -continue lovenox -follow Hgb trend  3-cholangiocarcinoma: s/p biliary stent -plan is for initiation of palliative chemotherapy at [facility] after current infection is clear -discussed with patient's primary oncologist -will monitor LFT's  4-hx of asthma: mild intermittent and no complicated -will continue nebulizer treatment -no wheezing currently on exam  5-hx of HTN: diet controlled -BP is stable -will monitor VS  6-constipation:  -will use sorbitol and simethicone -will also add reglan to help with motility -will ask palliative care to assist with symptoms management and will follow rec's  7-AKI: no prior records for comparison -assume secondary to pre-renal azotemia -will continue gentle IVF's resuscitation -follow renal function -no signs of UTI on UA  8-severe protein calorie malnutrition: in setting of acute illness/malignancy -will follow nutritional service recommendations for feeding supplements   Code Status: DNR Family Communication: wife at bedside Disposition Plan: remains inpatient, continue IV antibiotics, follow cultures speciation and sensitivity. Will follow [place] meeting and addressed constipation    Consultants:  Discussed with  his primary Oncologist at [facility] (Dr Rushie Nyhan)  Palliative Care  Procedures:  See below for x-ray reports   Antibiotics:  Vanc/zosyn 10/06  HPI/Subjective: No further fever, improved in his WBC's, no CP. Main complaint is abd distension, no BM and early satiety   Objective: Filed Vitals:   01/07/15 1414  BP: 118/77  Pulse: 86  Temp: 98 F (36.7 C)  Resp: 16    Intake/Output Summary (Last 24 hours) at 01/07/15 1811 Last data filed at 01/07/15 1445  Gross per 24 hour  Intake 2157.92 ml  Output      0 ml  Net 2157.92 ml   Filed Weights   01/05/15 1907 01/05/15 2150  Weight: 82.101 kg (181 lb) 84 kg (185 lb 3 oz)    Exam:   General:  Afebrile, no CP, breathing somewhat better. reports some mild abd pain, and ab distension. No further BM and been unable to eat as he feels full right away. No icterus on exam.  Cardiovascular: S1 and S2, no rubs, no gallops, no JVD  Respiratory: scattered rhonchi, mild exp wheezing, no rales  Abdomen: soft, distended, no guarding, positive BS (but decreased)  Musculoskeletal: no edema, no cyanosis  Data Reviewed: Basic Metabolic Panel:  Recent Labs Lab 01/05/15 1717 01/06/15 0555 01/07/15 0519  NA 135 136 137  K 3.9 3.7 3.7  CL 107 109 105  CO2 19* 21* 21*  GLUCOSE 136* 117* 85  BUN 25* 24* 28*  CREATININE 1.37* 1.39* 1.51*  CALCIUM 7.7* 7.7* 7.6*   Liver Function Tests:  Recent Labs Lab 01/05/15 1717 01/06/15 0555  AST 126* 114*  ALT 129* 116*  ALKPHOS 453* 368*  BILITOT 2.1* 2.0*  PROT 6.0* 5.7*  ALBUMIN 2.4* 2.1*   CBC:  Recent Labs Lab 01/05/15 1717 01/06/15 0555 01/07/15 5009  WBC 16.4* 12.5* 10.2  NEUTROABS 15.3* 9.8*  --   HGB 9.3* 8.3* 8.0*  HCT 28.9* 26.0* 24.9*  MCV 90.6 90.6 90.9  PLT 138* 120* 149*   CBG: No results for input(s): GLUCAP in the last 168 hours.  Recent Results (from the past 240 hour(s))  Blood Culture (routine x 2)     Status: None (Preliminary result)    Collection Time: 01/05/15  5:05 PM  Result Value Ref Range Status   Specimen Description BLOOD LEFT ANTECUBITAL  Final   Special Requests BOTTLES DRAWN AEROBIC AND ANAEROBIC 5CC  Final   Culture  Setup Time   Final    GRAM NEGATIVE RODS IN BOTH AEROBIC AND ANAEROBIC BOTTLES CRITICAL RESULT CALLED TO, READ BACK BY AND VERIFIED WITH: LIRA @0635  01/06/15 MKELLY    Culture   Final    GRAM NEGATIVE RODS Performed at [facility]    Report Status PENDING  Incomplete  Blood Culture (routine x 2)     Status: None (Preliminary result)   Collection Time: 01/05/15  5:48 PM  Result Value Ref Range Status   Specimen Description BLOOD LEFT FOREARM  Final   Special Requests BOTTLES DRAWN AEROBIC AND ANAEROBIC 5CC  Final   Culture  Setup Time   Final    GRAM NEGATIVE RODS IN BOTH AEROBIC AND ANAEROBIC BOTTLES CRITICAL RESULT CALLED TO, READ BACK BY AND VERIFIED WITH: K PHILLIPS,RN AT 0946 01/06/15 BY L BENFIELD    Culture   Final    GRAM NEGATIVE RODS Performed at [facility]    Report Status PENDING  Incomplete  Urine culture     Status: None   Collection Time: 01/05/15  7:02 PM  Result Value Ref Range Status   Specimen Description URINE, CLEAN CATCH  Final   Special Requests NONE  Final   Culture   Final    NO GROWTH 1 DAY Performed at [facility]    Report Status 01/06/2015 FINAL  Final     Studies: No results found.  Scheduled Meds: . antiseptic oral rinse  7 mL Mouth Rinse q12n4p  . budesonide (PULMICORT) nebulizer solution  0.25 mg Nebulization BID  . chlorhexidine  15 mL Mouth Rinse BID  . clotrimazole  1 application Topical BID  . enoxaparin  85 mg Subcutaneous BID  . finasteride  1 mg Oral Daily  . metoCLOPramide (REGLAN) injection  5 mg Intravenous 3 times per day  . milk and molasses  1 enema Rectal Once  . montelukast  10 mg Oral Daily  . piperacillin-tazobactam  3.375 g Intravenous 3 times per day  . polyethylene glycol  17 g Oral BID  .  senna-docusate  1 tablet Oral BID  . sodium chloride  3 mL Intravenous Q12H  . vancomycin  750 mg Intravenous Q12H   Continuous Infusions: . sodium chloride 50 mL/hr at 01/07/15 1404    Principal Problem:   HCAP (healthcare-associated pneumonia) Active Problems:   Anemia   Bilateral pulmonary embolism ([place])   Cholangiocarcinoma (HCC)   Asthma   Hypertension   Protein-calorie malnutrition, severe   Constipation   Encounter for palliative care    Time spent: 21 minutes    Barton Dubois  [facility] Pager [phone]. If 7PM-7AM, please contact night-coverage at [email or web address], password [facility] 01/07/2015, 6:11 PM  LOS: 2 days

## 2015-01-07 NOTE — Progress Notes (Addendum)
ANTIBIOTIC CONSULT NOTE - Follow Up  Pharmacy Consult for Vancomycin Indication: pneumonia  Allergies  Allergen Reactions  . Amlodipine Swelling    'ankles'  . Hctz [Hydrochlorothiazide] Rash    Patient Measurements: Height: 5\' 10"  (177.8 cm) Weight: 185 lb 3 oz (84 kg) IBW/kg (Calculated) : 73   Vital Signs: Temp: 98 F (36.7 C) (10/08 0632) Temp Source: Oral (10/08 7939) BP: 110/72 mmHg (10/08 0300) Pulse Rate: 82 (10/08 9233) Intake/Output from previous day: 10/07 0701 - 10/08 0700 In: 1538.8 [P.O.:720; I.V.:568.8; IV Piggyback:250] Out: -  Intake/Output from this shift:    Labs:  Recent Labs  01/05/15 1717 01/06/15 0555 01/07/15 0519  WBC 16.4* 12.5* 10.2  HGB 9.3* 8.3* 8.0*  PLT 138* 120* 149*  CREATININE 1.37* 1.39* 1.51*   Estimated Creatinine Clearance: 47.7 mL/min (by C-G formula based on Cr of 1.51). No results for input(s): VANCOTROUGH, VANCOPEAK, VANCORANDOM, GENTTROUGH, GENTPEAK, GENTRANDOM, TOBRATROUGH, TOBRAPEAK, TOBRARND, AMIKACINPEAK, AMIKACINTROU, AMIKACIN in the last 72 hours.   Microbiology: Recent Results (from the past 720 hour(s))  Blood Culture (routine x 2)     Status: None (Preliminary result)   Collection Time: 01/05/15  5:05 PM  Result Value Ref Range Status   Specimen Description BLOOD LEFT ANTECUBITAL  Final   Special Requests BOTTLES DRAWN AEROBIC AND ANAEROBIC 5CC  Final   Culture  Setup Time   Final    GRAM NEGATIVE RODS IN BOTH AEROBIC AND ANAEROBIC BOTTLES CRITICAL RESULT CALLED TO, READ BACK BY AND VERIFIED WITH: LIRA @0635  01/06/15 MKELLY    Culture   Final    GRAM NEGATIVE RODS Performed at [facility]    Report Status PENDING  Incomplete  Blood Culture (routine x 2)     Status: None (Preliminary result)   Collection Time: 01/05/15  5:48 PM  Result Value Ref Range Status   Specimen Description BLOOD LEFT FOREARM  Final   Special Requests BOTTLES DRAWN AEROBIC AND ANAEROBIC 5CC  Final   Culture  Setup  Time   Final    GRAM NEGATIVE RODS IN BOTH AEROBIC AND ANAEROBIC BOTTLES CRITICAL RESULT CALLED TO, READ BACK BY AND VERIFIED WITH: K PHILLIPS,RN AT 0946 01/06/15 BY L BENFIELD    Culture   Final    GRAM NEGATIVE RODS Performed at [facility]    Report Status PENDING  Incomplete  Urine culture     Status: None   Collection Time: 01/05/15  7:02 PM  Result Value Ref Range Status   Specimen Description URINE, CLEAN CATCH  Final   Special Requests NONE  Final   Culture   Final    NO GROWTH 1 DAY Performed at [facility]    Report Status 01/06/2015 FINAL  Final    Assessment: 23 yoM recently diagnosed with cholangiocarcinoma presents with recurrent fevers.  CXR shows possibility of pneumonia.  Vancomycin and Zosyn started.    10/6 Vancomycin >> 10/6 Zosyn >>  10/6 blood x2: GNR 10/6 urine: NGF 10/7 urine strep/legionella: neg/IP  Dose changes/levels:  10/8 trough at 19:00 = ____ before 5th total dose  Today, Day #3 antibiotics Afebrile WBC improved to WNL SCr increased to 1.51, CrCl~46 ml/min/1.32m2 (normalized)  Goal of Therapy:  Vancomycin trough level 15-20 mcg/ml  Doses adjusted per renal function Eradication of infection  Plan:   Continue vancomycin 750 mg IV q12h.  Check trough tonight at 19:00 prior to 5th total dose  Continue Zosyn 3.375g IV q8h (4 hour infusion time).  10/8 vanc trough is 17 mcg/ml. Continue current dosing.  Royetta Asal PharmD, BCPS   01/07/2015,10:13 AM

## 2015-01-07 NOTE — Consult Note (Signed)
Consultation Note Date: 01/07/2015   Patient Name: Terrance Larsen  DOB: [birth year]  MRN: [id number]  Age / Sex: 69 y.o., male   PCP: Cari Caraway, MD Referring Physician: Barton Dubois, MD  Reason for Consultation: Establishing goals of care  Palliative Care Assessment and Plan Summary of Established Goals of Care and Medical Treatment Preferences  Naveed Humphres is a 69 y.o. male with a past medical history of asthma, hypertension, bilateral pulmonary embolism, recently diagnosed cholangiocarcinoma who comes to the emergency department with complaints of fever and chills for the last 2 days. The patient denies productive cough, but has increased shortness of breath from baseline. He complains of wheezing, but denies earaches, sore throat or rhinorrhea. He denies any known sick contacts, but has been diagnosed and treated at [facility] in [place] and at [place]. He was supposed to start palliative chemotherapy on Monday.  Patient   complains of severe constipation for the past 3 weeks, which has produced abdominal distention and is impairing his breathing. He has occasionally mild nausea, but denies emesis. Patient has been admitted to the hospitalist service, DNR DNI has been established and a palliative consult has been requested for further goals of care.   The patient is a pleasant gentleman resting in bed, he is in no distress. He how ever, has significant abdominal distension. He is mildly nauseous. His wife is not at the bedside. 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 The patient is originally from [place]. He has lived in [place] for a number of years. He currently lives in [place], [place] with his wife. He has 2 children. He developed blood clots and then was subsequently diagnosed with cholangiocarcinoma less than 3 weeks ago. He has constipation which is severe. He is at peace with his diagnosis, he does not want his life prolonged by  artificial means. He does not want to suffer. He states he has had a very healthy active life style, he does not want to be in pain. He wishes not to be a burden on his family. " I want to ride on out as smoothly as I can". " I don't want a messy death."  Introduced concept of hospice and palliative medicine. Discussed symptom management of constipation. Information given about going home with hospice. Patient is not sure if he ought to go ahead with palliative chemotherapy. We will have further conversations with his wife and the patient in his room on 01-08-15 at 0930. Additional recommendations will follow.   Thank you for the consult, we will follow along.    Contacts/Participants in Discussion: Primary Decision Maker:  Patient, then his wife. He has 2 grown children, one of whom lives locally in [place]: yes     Code Status/Advance Care Planning:  DNR  Symptom Management:   Constipation: milk and molasses enema, add senokot, continue miralax. Monitor. KUB results noted.   Palliative Prophylaxis: yes   Additional Recommendations (Limitations, Scope, Preferences):  Family meeting for further goals of care discussions on 01-08-15 at 0930 am  Psycho-social/Spiritual:   Support System: strong, wife.   Desire for further Chaplaincy support:no  Prognosis: < 6 months  Discharge Planning:  Home with Hospice    Values: transitioning towards a more comfort based approach to his care. Will delineate further in family meeting scheduled for 01-08-15 at 0930 am.  Life limiting illness: cholangiocarcinoma metastatic.       Chief Complaint/History of Present  Illness: fever chills   Primary Diagnoses  Present on Admission:  . HCAP (healthcare-associated pneumonia) . Anemia . Bilateral pulmonary embolism ([place]) . Cholangiocarcinoma ([place]) . Asthma . Hypertension  Palliative Review of Systems: Completed  I have reviewed the medical record, interviewed the patient and  family, and examined the patient. The following aspects are pertinent.  Past Medical History  Diagnosis Date  . Cholangiocarcinoma of biliary tract ([place])   . Hypertension   . Asthma    Social History   Social History  . Marital Status: Married    Spouse Name: N/A  . Number of Children: N/A  . Years of Education: N/A   Social History Main Topics  . Smoking status: Never Smoker   . Smokeless tobacco: None  . Alcohol Use: No  . Drug Use: No  . Sexual Activity: Not Asked   Other Topics Concern  . None   Social History Narrative  . None   History reviewed. No pertinent family history. Scheduled Meds: . antiseptic oral rinse  7 mL Mouth Rinse q12n4p  . budesonide (PULMICORT) nebulizer solution  0.25 mg Nebulization BID  . chlorhexidine  15 mL Mouth Rinse BID  . clotrimazole  1 application Topical BID  . enoxaparin  85 mg Subcutaneous BID  . finasteride  1 mg Oral Daily  . metoCLOPramide (REGLAN) injection  5 mg Intravenous 3 times per day  . milk and molasses  1 enema Rectal Once  . montelukast  10 mg Oral Daily  . piperacillin-tazobactam  3.375 g Intravenous 3 times per day  . polyethylene glycol  17 g Oral BID  . senna-docusate  1 tablet Oral BID  . sodium chloride  3 mL Intravenous Q12H  . vancomycin  750 mg Intravenous Q12H   Continuous Infusions: . sodium chloride 50 mL/hr at 01/07/15 1404   PRN Meds:.ipratropium-albuterol, ondansetron **OR** ondansetron (ZOFRAN) IV, simethicone Medications Prior to Admission:  Prior to Admission medications   Medication Sig Start Date End Date Taking? Authorizing Provider  clotrimazole (LOTRIMIN) 1 % cream Apply 1 application topically 2 (two) times daily.   Yes Historical Provider, MD  dexamethasone (DECADRON) 4 MG tablet Take 8 mg by mouth daily as needed. Take for three days after chemo 01/03/15  Yes Historical Provider, MD  docusate sodium (COLACE) 100 MG capsule Take 100 mg by mouth 3 (three) times daily.   Yes Historical  Provider, MD  DULERA 200-5 MCG/ACT AERO Inhale 2 puffs into the lungs 2 (two) times daily.  12/06/14  Yes Historical Provider, MD  enoxaparin (LOVENOX) 100 MG/ML injection Inject 0.85 mLs into the skin 2 (two) times daily. For thirty days start 9/19 12/19/14  Yes Historical Provider, MD  finasteride (PROPECIA) 1 MG tablet Take 1 mg by mouth daily.   Yes Historical Provider, MD  montelukast (SINGULAIR) 10 MG tablet Take 10 mg by mouth daily.   Yes Historical Provider, MD  omalizumab Arvid Right) 150 MG injection Inject into the skin every 28 (twenty-eight) days.    Yes Historical Provider, MD  ondansetron (ZOFRAN) 8 MG tablet Take 8 mg by mouth every 8 (eight) hours as needed. For nausea from chemo for three days after treatment 01/03/15  Yes Historical Provider, MD  [place] Starting Chemo 01/09/15 at [facility] .   Yes Historical Provider, MD  SENNA LAX 8.6 MG tablet Take 2 tablets by mouth every evening. 12/21/14  Yes Historical Provider, MD   Allergies  Allergen Reactions  . Amlodipine Swelling    '  ankles'  . Hctz [Hydrochlorothiazide] Rash   CBC:    Component Value Date/Time   WBC 10.2 01/07/2015 0519   HGB 8.0* 01/07/2015 0519   HCT 24.9* 01/07/2015 0519   PLT 149* 01/07/2015 0519   MCV 90.9 01/07/2015 0519   NEUTROABS 9.8* 01/06/2015 0555   LYMPHSABS 0.9 01/06/2015 0555   MONOABS 1.7* 01/06/2015 0555   EOSABS 0.0 01/06/2015 0555   BASOSABS 0.0 01/06/2015 0555   Comprehensive Metabolic Panel:    Component Value Date/Time   NA 137 01/07/2015 0519   K 3.7 01/07/2015 0519   CL 105 01/07/2015 0519   CO2 21* 01/07/2015 0519   BUN 28* 01/07/2015 0519   CREATININE 1.51* 01/07/2015 0519   GLUCOSE 85 01/07/2015 0519   CALCIUM 7.6* 01/07/2015 0519   AST 114* 01/06/2015 0555   ALT 116* 01/06/2015 0555   ALKPHOS 368* 01/06/2015 0555   BILITOT 2.0* 01/06/2015 0555   PROT 5.7* 01/06/2015 0555   ALBUMIN 2.1* 01/06/2015 0555    Physical Exam: Vital  Signs: BP 118/77 mmHg  Pulse 86  Temp(Src) 98 F (36.7 C) (Oral)  Resp 16  Ht 5\' 10"  (1.778 m)  Wt 84 kg (185 lb 3 oz)  BMI 26.57 kg/m2  SpO2 100% SpO2: SpO2: 100 % O2 Device: O2 Device: Nasal Cannula O2 Flow Rate: O2 Flow Rate (L/min): 2 L/min Intake/output summary:  Intake/Output Summary (Last 24 hours) at 01/07/15 1713 Last data filed at 01/07/15 1445  Gross per 24 hour  Intake 2157.92 ml  Output      0 ml  Net 2157.92 ml   LBM: Last BM Date: 01/05/15 Baseline Weight: Weight: 82.101 kg (181 lb) Most recent weight: Weight: 84 kg (185 lb 3 oz)  Exam Findings:  Awake alert mildly icteric mildly nauseous Diminished bases S1S2 Abdomen huge distension faint to minimal bowel sounds No edema Extremities warm dry Non focal                        Palliative Performance Scale: 30% Additional Data Reviewed: Recent Labs     01/06/15  0555  01/07/15  0519  WBC  12.5*  10.2  HGB  8.3*  8.0*  PLT  120*  149*  NA  136  137  BUN  24*  28*  CREATININE  1.39*  1.51*     Time In: 1600Time Out: 1700 Time Total 60 min  Greater than 50%  of this time was spent counseling and coordinating care related to the above assessment and plan.  Signed by: Loistine Chance, MD [id number] Loistine Chance, MD  01/07/2015, 5:13 PM  Please contact Palliative Medicine Team phone at [phone] for questions and concerns.

## 2015-01-08 ENCOUNTER — Encounter (HOSPITAL_COMMUNITY): Payer: Self-pay | Admitting: Radiology

## 2015-01-08 ENCOUNTER — Inpatient Hospital Stay (HOSPITAL_COMMUNITY): Payer: Federal, State, Local not specified - PPO

## 2015-01-08 DIAGNOSIS — R140 Abdominal distension (gaseous): Secondary | ICD-10-CM | POA: Insufficient documentation

## 2015-01-08 DIAGNOSIS — R188 Other ascites: Secondary | ICD-10-CM | POA: Insufficient documentation

## 2015-01-08 LAB — CBC
HEMATOCRIT: 25 % — AB (ref 39.0–52.0)
Hemoglobin: 8 g/dL — ABNORMAL LOW (ref 13.0–17.0)
MCH: 28.7 pg (ref 26.0–34.0)
MCHC: 32 g/dL (ref 30.0–36.0)
MCV: 89.6 fL (ref 78.0–100.0)
Platelets: 173 10*3/uL (ref 150–400)
RBC: 2.79 MIL/uL — AB (ref 4.22–5.81)
RDW: 15.5 % (ref 11.5–15.5)
WBC: 9.1 10*3/uL (ref 4.0–10.5)

## 2015-01-08 LAB — BASIC METABOLIC PANEL
ANION GAP: 8 (ref 5–15)
BUN: 26 mg/dL — AB (ref 6–20)
CO2: 21 mmol/L — AB (ref 22–32)
Calcium: 7.7 mg/dL — ABNORMAL LOW (ref 8.9–10.3)
Chloride: 111 mmol/L (ref 101–111)
Creatinine, Ser: 1.49 mg/dL — ABNORMAL HIGH (ref 0.61–1.24)
GFR calc Af Amer: 53 mL/min — ABNORMAL LOW (ref >60)
GFR calc non Af Amer: 46 mL/min — ABNORMAL LOW (ref >60)
GLUCOSE: 91 mg/dL (ref 65–99)
POTASSIUM: 3.6 mmol/L (ref 3.5–5.1)
Sodium: 140 mmol/L (ref 135–145)

## 2015-01-08 LAB — CULTURE, BLOOD (ROUTINE X 2)

## 2015-01-08 MED ORDER — CIPROFLOXACIN HCL 500 MG PO TABS
500.0000 mg | ORAL_TABLET | Freq: Two times a day (BID) | ORAL | Status: DC
  Administered 2015-01-09 – 2015-01-10 (×3): 500 mg via ORAL
  Filled 2015-01-08 (×3): qty 1

## 2015-01-08 MED ORDER — IOHEXOL 300 MG/ML  SOLN
100.0000 mL | Freq: Once | INTRAMUSCULAR | Status: AC | PRN
  Administered 2015-01-08: 100 mL via INTRAVENOUS

## 2015-01-08 MED ORDER — IOHEXOL 300 MG/ML  SOLN
50.0000 mL | Freq: Once | INTRAMUSCULAR | Status: AC | PRN
  Administered 2015-01-08: 50 mL via ORAL

## 2015-01-08 MED ORDER — POLYETHYLENE GLYCOL 3350 17 G PO PACK: 17.0000 g | PACK | Freq: Every day | ORAL | Status: DC | PRN

## 2015-01-08 MED ORDER — ALBUMIN HUMAN 25 % IV SOLN
50.0000 g | Freq: Four times a day (QID) | INTRAVENOUS | Status: AC
  Filled 2015-01-08 (×3): qty 200

## 2015-01-08 MED ORDER — SIMETHICONE 40 MG/0.6ML PO SUSP
40.0000 mg | Freq: Four times a day (QID) | ORAL | Status: DC | PRN
  Filled 2015-01-08: qty 0.6

## 2015-01-08 MED ORDER — PIPERACILLIN-TAZOBACTAM 3.375 G IVPB
3.3750 g | Freq: Three times a day (TID) | INTRAVENOUS | Status: AC
  Administered 2015-01-08: 3.375 g via INTRAVENOUS
  Filled 2015-01-08: qty 50

## 2015-01-08 NOTE — Progress Notes (Signed)
Daily Progress Note   Patient Name: Terrance Larsen       Date: 01/08/2015 DOB: 06-28-1945  Age: 69 y.o. MRN#: [id number] Attending Physician: Barton Dubois, MD Primary Care Physician: Cari Caraway, MD Admit Date: 01/05/2015  Reason for Consultation/Follow-up: Establishing goals of care  Subjective:  resting in bed, in mild distress due to abdominal pain, distension, ongoing constipation.   Interval Events: Patient received milk and molasses enema. He had minimal bowel movement. How ever, he did pass some gas.  Appetite is diminished. No nausea no vomiting.  Patient complains of sharp abdominal pain from umbilicus to his supra pubic region. He also complains of a burning type pain in his RLQ.  Family meeting:  Discussed in detail with the patient and his wife about the patient's recent diagnosis of cholangiocarcinoma, his acute medical conditions of PNA, constipation, his underlying bilateral PE.  DNR DNI Patient states he wants a good plan in place, he and his wife are accepting of Hospice support.   PLAN: CT abdomen pelvis today, follow up from [facility] sowing constipation Will need to re schedule follow up with his oncologist at [facility].  Patient may or may not ultimately decide to proceed with palliative chemotherapy, he does not want life prolongation at the expense of feeling poorly or at the expense of having further decline in his functional status.   All questions answered. We will continue to follow up.    Length of Stay: 3 days  Current Medications: Scheduled Meds:  . antiseptic oral rinse  7 mL Mouth Rinse q12n4p  . budesonide (PULMICORT) nebulizer solution  0.25 mg Nebulization BID  . chlorhexidine  15 mL Mouth Rinse BID  . clotrimazole  1 application Topical BID  . enoxaparin  85 mg Subcutaneous BID  . finasteride  1 mg Oral Daily  . metoCLOPramide (REGLAN) injection  5 mg Intravenous 3 times per day  . montelukast  10 mg Oral Daily  . piperacillin-tazobactam   3.375 g Intravenous 3 times per day  . polyethylene glycol  17 g Oral BID  . senna-docusate  1 tablet Oral BID  . sodium chloride  3 mL Intravenous Q12H  . vancomycin  750 mg Intravenous Q12H    Continuous Infusions: . sodium chloride 50 mL/hr at 01/07/15 1404    PRN Meds: ipratropium-albuterol, ondansetron **OR** ondansetron (ZOFRAN) IV, simethicone  Palliative Performance Scale: 30%     Vital Signs: BP 126/76 mmHg  Pulse 77  Temp(Src) 98.7 F (37.1 C) (Oral)  Resp 16  Ht 5\' 10"  (1.778 m)  Wt 84 kg (185 lb 3 oz)  BMI 26.57 kg/m2  SpO2 97% SpO2: SpO2: 97 % O2 Device: O2 Device: Not Delivered O2 Flow Rate: O2 Flow Rate (L/min): 2 L/min  Intake/output summary:  Intake/Output Summary (Last 24 hours) at 01/08/15 1016 Last data filed at 01/08/15 0729  Gross per 24 hour  Intake 1349.17 ml  Output      1 ml  Net 1348.17 ml   LBM:   Baseline Weight: Weight: 82.101 kg (181 lb) Most recent weight: Weight: 84 kg (185 lb 3 oz)  Physical Exam: Abdomen diffusely distended, some faint bowel sounds Lungs diminished bases S1S2 Awake alert Mild distress            Additional Data Reviewed: Recent Labs     01/07/15  0519  01/08/15  0500  WBC  10.2  9.1  HGB  8.0*  8.0*  PLT  149*  173  NA  137  140  BUN  28*  26*  CREATININE  1.51*  1.49*     Problem List:  Patient Active Problem List   Diagnosis Date Noted  . Constipation   . Encounter for palliative care   . Bacteremia   . AKI (acute kidney injury) ([place])   . Protein-calorie malnutrition, severe 01/06/2015  . HCAP (healthcare-associated pneumonia) 01/05/2015  . Anemia 01/05/2015  . Bilateral pulmonary embolism ([place]) 01/05/2015  . Cholangiocarcinoma ([place]) 01/05/2015  . Asthma 01/05/2015  . Hypertension 01/05/2015     Palliative Care Assessment & Plan    Code Status:  DNR  Goals of Care:   as above   Desire for further Chaplaincy support:no  3. Symptom Management:   as above   4.  Palliative Prophylaxis:  Stool Softener: yes  5. Prognosis: < 6 months  5. Discharge Planning: Home with Hospice likely    Care plan was discussed with patient wife Dr Dyann Kief   Thank you for allowing the Palliative Medicine Team to assist in the care of this patient.   Time In: 0930 Time Out: 1015 Total Time 45 Prolonged Time Billed  no     Greater than 50%  of this time was spent counseling and coordinating care related to the above assessment and plan.  Romani Wilbon Burt Knack, MD  01/08/2015, 10:16 AM  [phone] Please contact Palliative Medicine Team phone at [phone] for questions and concerns.

## 2015-01-08 NOTE — Progress Notes (Signed)
[facility] PROGRESS NOTE  Terrance Larsen OXB:[id number] DOB: [birth year] DOA: 01/05/2015 PCP: Cari Caraway, MD  Assessment/Plan: 1-SIRS/early sepsis: due to enterococcus sakazakii bacteremia  -patient on presentation with RR of 27, HR 107, temp 101.9 and WBC's 16.4 -positive blood cx's for gram neg rods 2/2 (positive for enterococcus sakazakii)  -CXR with concerns for early PNA, but no coughing and per patient impairment in his breathing due to distended abdomen. Most likely atelectasis  -case discussed with ID; will d/c vancomycin. Zosyn to be continued X 1 more day and then switch to 10 days of ciprofloxacin to complete treatment.  -continue supportive care -follow clinical response  -will continue flutter valve and pulmonary toiletry (to help with atelectasis) -no further fever and WBC's WNL now    2-hx of bilateral PE -continue lovenox -follow Hgb trend  3-cholangiocarcinoma: s/p biliary stent -plan is for initiation of palliative chemotherapy at [facility] after current infection is clear -discussed with patient's primary oncologist -will monitor LFT's  4-hx of asthma: mild intermittent and no complicated -will continue nebulizer treatment -no wheezing currently on exam  5-hx of HTN: diet controlled -BP is stable -will monitor VS  6-constipation:  -no BM despite aggressive laxatives treatment -CT scan of his adb done and demonstrating severe ascites, no stool burden and no obstruction -will arrange paracentesis with IR -will continue PRN simethicone -will also continue reglan to help with motility -will ask palliative care to assist with symptoms management and will follow rec's  7-AKI: no prior records for comparison -assume secondary to pre-renal azotemia -will continue very gentle IVF's resuscitation -follow renal function trend -no signs of UTI on UA  8-severe protein calorie malnutrition: in setting of acute illness/malignancy -will follow nutritional  service recommendations for feeding supplements   Code Status: DNR Family Communication: wife at bedside Disposition Plan: remains inpatient, continue IV antibiotics X 1 more day, arrange for paracentesis. Appreciate Palliative care assistance and care. Transition to PO abx's in am    Consultants:  Discussed with his primary Oncologist at [facility] (Dr Rushie Nyhan)  [place]  IR (for therapeutic paracentesis)   Procedures:  See below for x-ray reports   Antibiotics:  Vanc 10/06>>10/09  Zosyn 10/06>>10/09  Ciprofloxacin 10/10>>10/20  HPI/Subjective: Afebrile (for 48 hours now), no SOB, no CP. Reported no BM, no nausea, no vomiting. complaining of abd distension and intermittent pain.  Objective: Filed Vitals:   01/08/15 1330  BP: 125/70  Pulse: 86  Temp: 97.9 F (36.6 C)  Resp: 20    Intake/Output Summary (Last 24 hours) at 01/08/15 1437 Last data filed at 01/08/15 0729  Gross per 24 hour  Intake 959.17 ml  Output      1 ml  Net 958.17 ml   Filed Weights   01/05/15 1907 01/05/15 2150  Weight: 82.101 kg (181 lb) 84 kg (185 lb 3 oz)    Exam:   General:  Afebrile, no CP, breathing better and no requiring oxygen supplementation. reproting to have some mild abd pain, and worsening distension. No BM and been unable to eat as he feels full right away. Mild icterus on exam.  Cardiovascular: S1 and S2, no rubs, no gallops, no JVD  Respiratory: scattered rhonchi, mild exp wheezing, no rales  Abdomen: soft, distended, positive fluid wave, no guarding, positive BS (but decreased)  Musculoskeletal: no edema, no cyanosis  Data Reviewed: Basic Metabolic Panel:  Recent Labs Lab 01/05/15 1717 01/06/15 0555 01/07/15 0519 01/08/15 0500  NA 135 136 137 140  K 3.9 3.7 3.7  3.6  CL 107 109 105 111  CO2 19* 21* 21* 21*  GLUCOSE 136* 117* 85 91  BUN 25* 24* 28* 26*  CREATININE 1.37* 1.39* 1.51* 1.49*  CALCIUM 7.7* 7.7* 7.6* 7.7*   Liver Function  Tests:  Recent Labs Lab 01/05/15 1717 01/06/15 0555  AST 126* 114*  ALT 129* 116*  ALKPHOS 453* 368*  BILITOT 2.1* 2.0*  PROT 6.0* 5.7*  ALBUMIN 2.4* 2.1*   CBC:  Recent Labs Lab 01/05/15 1717 01/06/15 0555 01/07/15 0519 01/08/15 0500  WBC 16.4* 12.5* 10.2 9.1  NEUTROABS 15.3* 9.8*  --   --   HGB 9.3* 8.3* 8.0* 8.0*  HCT 28.9* 26.0* 24.9* 25.0*  MCV 90.6 90.6 90.9 89.6  PLT 138* 120* 149* 173   CBG: No results for input(s): GLUCAP in the last 168 hours.  Recent Results (from the past 240 hour(s))  Blood Culture (routine x 2)     Status: None   Collection Time: 01/05/15  5:05 PM  Result Value Ref Range Status   Specimen Description BLOOD LEFT ANTECUBITAL  Final   Special Requests BOTTLES DRAWN AEROBIC AND ANAEROBIC 5CC  Final   Culture  Setup Time   Final    GRAM NEGATIVE RODS IN BOTH AEROBIC AND ANAEROBIC BOTTLES CRITICAL RESULT CALLED TO, READ BACK BY AND VERIFIED WITH: LIRA @0635  01/06/15 MKELLY    Culture   Final    ENTEROBACTER SAKAZAKII Performed at [facility]    Report Status 01/08/2015 FINAL  Final   Organism ID, Bacteria ENTEROBACTER SAKAZAKII  Final      Susceptibility   Enterobacter sakazakii - MIC*    CEFAZOLIN 16 RESISTANT Resistant     CEFEPIME <=1 SENSITIVE Sensitive     CEFTAZIDIME <=1 SENSITIVE Sensitive     CEFTRIAXONE <=1 SENSITIVE Sensitive     CIPROFLOXACIN <=0.25 SENSITIVE Sensitive     GENTAMICIN <=1 SENSITIVE Sensitive     IMIPENEM <=0.25 SENSITIVE Sensitive     TRIMETH/SULFA <=20 SENSITIVE Sensitive     PIP/TAZO <=4 SENSITIVE Sensitive     * ENTEROBACTER SAKAZAKII  Blood Culture (routine x 2)     Status: None   Collection Time: 01/05/15  5:48 PM  Result Value Ref Range Status   Specimen Description BLOOD LEFT FOREARM  Final   Special Requests BOTTLES DRAWN AEROBIC AND ANAEROBIC 5CC  Final   Culture  Setup Time   Final    GRAM NEGATIVE RODS IN BOTH AEROBIC AND ANAEROBIC BOTTLES CRITICAL RESULT CALLED TO, READ BACK BY  AND VERIFIED WITH: K PHILLIPS,RN AT [place] 01/06/15 BY L BENFIELD    Culture   Final    ENTEROBACTER SAKAZAKII Performed at [facility]    Report Status 01/08/2015 FINAL  Final  Urine culture     Status: None   Collection Time: 01/05/15  7:02 PM  Result Value Ref Range Status   Specimen Description URINE, CLEAN CATCH  Final   Special Requests NONE  Final   Culture   Final    NO GROWTH 1 DAY Performed at [facility]    Report Status 01/06/2015 FINAL  Final     Studies: Ct Abdomen Pelvis W Contrast  01/08/2015   CLINICAL DATA:  Constipation, abdominal distention. Current history of cholangiocarcinoma.  EXAM: CT ABDOMEN AND PELVIS WITH CONTRAST  TECHNIQUE: Multidetector CT imaging of the abdomen and pelvis was performed using the standard protocol following bolus administration of intravenous contrast.  CONTRAST:  69mL OMNIPAQUE IOHEXOL 300 MG/ML SOLN,  185mL OMNIPAQUE IOHEXOL 300 MG/ML SOLN  COMPARISON:  None.  FINDINGS: Severe degenerative disc disease is noted at L5-S1. Visualized lung bases are unremarkable.  No gallstones are noted, although gallbladder is contracted. Large irregular mass is noted in left hepatic lobe which extends inferiorly toward the pancreas consistent with a history of cholangiocarcinoma. Biliary stent is seen extending from the liver into the duodenum. The spleen appears normal. Moderate ascites is noted. Adrenal glands and kidneys are unremarkable. IVC filter is noted in infrarenal position. Atherosclerosis of abdominal aorta is noted. Multiple omental masses are noted consistent with peritoneal carcinomatosis. Extensive retroperitoneal adenopathy is noted also consistent with metastatic disease. There is no evidence of bowel obstruction. No significant amount of stool is seen throughout the colon. Urinary bladder is decompressed. Moderate prostatic enlargement is noted.  IMPRESSION: Irregular mass is seen involving the left hepatic lobe consistent with  history of cholangiocarcinoma. It extends inferiorly toward the pancreas, but does not appear to definitely involve the pancreas. Biliary stent is in grossly good position. No significant intrahepatic biliary dilatation is seen at this time.  Peritoneal carcinomatosis is noted. Retroperitoneal adenopathy is noted consistent with metastatic disease.  Moderate amount of ascites is noted.  There is no evidence of bowel obstruction. There does not appear to be a significant amount of stool present within the colon.   Electronically Signed   By: Marijo Conception, M.D.   On: 01/08/2015 14:02    Scheduled Meds: . albumin human  50 g Intravenous Q6H  . antiseptic oral rinse  7 mL Mouth Rinse q12n4p  . budesonide (PULMICORT) nebulizer solution  0.25 mg Nebulization BID  . chlorhexidine  15 mL Mouth Rinse BID  . [START ON 01/09/2015] ciprofloxacin  500 mg Oral BID  . clotrimazole  1 application Topical BID  . enoxaparin  85 mg Subcutaneous BID  . finasteride  1 mg Oral Daily  . metoCLOPramide (REGLAN) injection  5 mg Intravenous 3 times per day  . montelukast  10 mg Oral Daily  . piperacillin-tazobactam  3.375 g Intravenous 3 times per day  . sodium chloride  3 mL Intravenous Q12H   Continuous Infusions: . sodium chloride 50 mL/hr at 01/08/15 1211    Principal Problem:   HCAP (healthcare-associated pneumonia) Active Problems:   Anemia   Bilateral pulmonary embolism ([facility])   Cholangiocarcinoma ([facility])   Asthma   Hypertension   Protein-calorie malnutrition, severe   Constipation   Encounter for palliative care   Bacteremia   AKI (acute kidney injury) ([place])    Time spent: 6 minutes    Barton Dubois  [facility] Pager [phone]. If 7PM-7AM, please contact night-coverage at [email or web address], password [facility] 01/08/2015, 2:37 PM  LOS: 3 days

## 2015-01-09 ENCOUNTER — Inpatient Hospital Stay (HOSPITAL_COMMUNITY): Payer: Federal, State, Local not specified - PPO

## 2015-01-09 LAB — CBC WITH DIFFERENTIAL/PLATELET
BASOS ABS: 0 10*3/uL (ref 0.0–0.1)
BASOS PCT: 0 %
Eosinophils Absolute: 0.2 10*3/uL (ref 0.0–0.7)
Eosinophils Relative: 2 %
HEMATOCRIT: 26.4 % — AB (ref 39.0–52.0)
HEMOGLOBIN: 8.4 g/dL — AB (ref 13.0–17.0)
Lymphocytes Relative: 6 %
Lymphs Abs: 0.7 10*3/uL (ref 0.7–4.0)
MCH: 28.3 pg (ref 26.0–34.0)
MCHC: 31.8 g/dL (ref 30.0–36.0)
MCV: 88.9 fL (ref 78.0–100.0)
MONOS PCT: 13 %
Monocytes Absolute: 1.5 10*3/uL — ABNORMAL HIGH (ref 0.1–1.0)
NEUTROS ABS: 8.6 10*3/uL — AB (ref 1.7–7.7)
NEUTROS PCT: 79 %
Platelets: 221 10*3/uL (ref 150–400)
RBC: 2.97 MIL/uL — AB (ref 4.22–5.81)
RDW: 15.4 % (ref 11.5–15.5)
WBC: 11 10*3/uL — AB (ref 4.0–10.5)

## 2015-01-09 LAB — COMPREHENSIVE METABOLIC PANEL
ALK PHOS: 443 U/L — AB (ref 38–126)
ALT: 90 U/L — ABNORMAL HIGH (ref 17–63)
ANION GAP: 11 (ref 5–15)
AST: 96 U/L — ABNORMAL HIGH (ref 15–41)
Albumin: 2 g/dL — ABNORMAL LOW (ref 3.5–5.0)
BUN: 24 mg/dL — ABNORMAL HIGH (ref 6–20)
CALCIUM: 7.8 mg/dL — AB (ref 8.9–10.3)
CO2: 19 mmol/L — AB (ref 22–32)
Chloride: 108 mmol/L (ref 101–111)
Creatinine, Ser: 1.59 mg/dL — ABNORMAL HIGH (ref 0.61–1.24)
GFR, EST AFRICAN AMERICAN: 49 mL/min — AB (ref >60)
GFR, EST NON AFRICAN AMERICAN: 43 mL/min — AB (ref >60)
Glucose, Bld: 98 mg/dL (ref 65–99)
Potassium: 3.3 mmol/L — ABNORMAL LOW (ref 3.5–5.1)
SODIUM: 138 mmol/L (ref 135–145)
Total Bilirubin: 1.7 mg/dL — ABNORMAL HIGH (ref 0.3–1.2)
Total Protein: 5.7 g/dL — ABNORMAL LOW (ref 6.5–8.1)

## 2015-01-09 LAB — MAGNESIUM: MAGNESIUM: 2.3 mg/dL (ref 1.7–2.4)

## 2015-01-09 LAB — TROPONIN I: Troponin I: 0.03 ng/mL (ref <0.031)

## 2015-01-09 MED ORDER — POTASSIUM CHLORIDE CRYS ER 20 MEQ PO TBCR
40.0000 meq | EXTENDED_RELEASE_TABLET | ORAL | Status: AC
  Administered 2015-01-09 (×2): 40 meq via ORAL
  Filled 2015-01-09 (×2): qty 2

## 2015-01-09 MED ORDER — ALBUMIN HUMAN 25 % IV SOLN
50.0000 g | Freq: Four times a day (QID) | INTRAVENOUS | Status: AC
  Administered 2015-01-09 (×3): 50 g via INTRAVENOUS
  Filled 2015-01-09 (×4): qty 200

## 2015-01-09 NOTE — Procedures (Signed)
Successful US guided paracentesis from LLQ.  Yielded 3.8 liters of serous fluid.  No immediate complications.  Pt tolerated well.   Specimen was not sent for labs.  Tsosie Billing D PA-C 01/09/2015 10:22 AM

## 2015-01-09 NOTE — Progress Notes (Signed)
Patient has had several runs of V-tach ranging from 5 to 12 beats overnight.  Pt denied any new pain.  NP on call was notified and new orders were received; will continue to monitor patient.

## 2015-01-09 NOTE — Progress Notes (Signed)
Daily Progress Note   Patient Name: Terrance Larsen       Date: 01/09/2015 DOB: 04-Jan-1946  Age: 69 y.o. MRN#: [id number] Attending Physician: Barton Dubois, MD Primary Care Physician: Cari Caraway, MD Admit Date: 01/05/2015  Reason for Consultation/Follow-up: Establishing goals of care  Subjective:  resting in bed, in mild distress due to abdominal pain, distension, ongoing constipation.   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 He enjoys reading books, he is not able to concentrate enough to read on his kindle. He is getting some rest at night.    DNR DNI Patient states he wants a good plan in place, he and his wife are accepting of Hospice support.    We will continue to follow up.    Length of Stay: 4 days  Current Medications: Scheduled Meds:  . antiseptic oral rinse  7 mL Mouth Rinse q12n4p  . budesonide (PULMICORT) nebulizer solution  0.25 mg Nebulization BID  . chlorhexidine  15 mL Mouth Rinse BID  . ciprofloxacin  500 mg Oral BID  . clotrimazole  1 application Topical BID  . enoxaparin  85 mg Subcutaneous BID  . finasteride  1 mg Oral Daily  . metoCLOPramide (REGLAN) injection  5 mg Intravenous 3 times per day  . montelukast  10 mg Oral Daily  . sodium chloride  3 mL Intravenous Q12H    Continuous Infusions: . sodium chloride 50 mL/hr at 01/09/15 0556    PRN Meds: ipratropium-albuterol, ondansetron **OR** ondansetron (ZOFRAN) IV, polyethylene glycol, simethicone  Palliative Performance Scale: 30%     Vital Signs: BP 125/66 mmHg  Pulse 89  Temp(Src) 98.7 F (37.1 C) (Oral)  Resp 20  Ht 5\' 10"  (1.778 m)  Wt 84 kg  (185 lb 3 oz)  BMI 26.57 kg/m2  SpO2 96% SpO2: SpO2: 96 % O2 Device: O2 Device: Not Delivered O2 Flow Rate: O2 Flow Rate (L/min): 2 L/min  Intake/output summary:   Intake/Output Summary (Last 24 hours) at 01/09/15 0945 Last data filed at 01/09/15 0700  Gross per 24 hour  Intake   1250 ml  Output      0 ml  Net   1250 ml   LBM:   Baseline Weight: Weight: 82.101 kg (181 lb) Most recent weight: Weight: 84 kg (185 lb 3 oz)  Physical Exam: Abdomen diffusely distended, some faint bowel sounds Lungs diminished bases S1S2 Awake alert Mild distress            Additional Data Reviewed: Recent Labs     01/08/15  0500  01/09/15  0430  WBC  9.1  11.0*  HGB  8.0*  8.4*  PLT  173  221  NA  140  138  BUN  26*  24*  CREATININE  1.49*  1.59*     Problem List:  Patient Active Problem List   Diagnosis Date Noted  . Abdominal distension   . Ascites   . Constipation   . Encounter for palliative care   . Bacteremia   . AKI (acute kidney injury) ([place])   . Protein-calorie malnutrition, severe 01/06/2015  . HCAP (healthcare-associated pneumonia) 01/05/2015  . Anemia 01/05/2015  . Bilateral  pulmonary embolism ([place]) 01/05/2015  . Cholangiocarcinoma ([place]) 01/05/2015  . Asthma 01/05/2015  . Hypertension 01/05/2015     Palliative Care Assessment & Plan    Code Status:  DNR  Goals of Care:   as above   Desire for further Chaplaincy support:no  3. Symptom Management:   as above   4. Palliative Prophylaxis:  Stool Softener: yes  5. Prognosis: < 6 months  5. Discharge Planning: Home with Hospice likely    Care plan was discussed with patient RN and Dr Dyann Kief   Thank you for allowing the Palliative Medicine Team to assist in the care of this patient.   Time In: 0900 Time Out: 0930 Total Time 30 Prolonged Time Billed  no     Greater than 50%  of this time was spent counseling and coordinating care related to the above assessment and plan.  Destanie Tibbetts Burt Knack, MD  01/09/2015, 9:45 AM  [phone] Please contact Palliative Medicine Team phone at [phone] for questions and concerns.

## 2015-01-09 NOTE — Progress Notes (Signed)
[facility] PROGRESS NOTE  Terrance Larsen YQM:[id number] DOB: 22-Aug-1945 DOA: 01/05/2015 PCP: Cari Caraway, MD  Assessment/Plan: 1-SIRS/early sepsis: due to enterococcus sakazakii bacteremia  -patient on presentation with RR of 27, HR 107, temp 101.9 and WBC's 16.4 -positive blood cx's for gram neg rods 2/2 (positive for enterococcus sakazakii)  -CXR with concerns for early PNA, but no coughing and per patient impairment in his breathing due to distended abdomen. Most likely atelectasis  -case discussed with ID; will start 10 days of ciprofloxacin to complete treatment.  -continue supportive care -follow clinical response  -will continue flutter valve and pulmonary toiletry (to help with atelectasis) -no further fever   2-hx of bilateral PE -continue lovenox -follow Hgb trend -no signs of overt bleeding   3-cholangiocarcinoma: s/p biliary stent -plan is for initiation of palliative chemotherapy at [facility] after current infection is clear -discussed with patient's primary oncologist -will monitor LFT's  4-hx of asthma: mild intermittent and no complicated -will continue nebulizer treatment -no wheezing currently on exam  5-hx of HTN: diet controlled -BP is stable -will monitor VS  6-constipation/Ascites:  -no BM despite aggressive laxatives treatment -CT scan of his adb done and demonstrating severe ascites, no stool burden and no obstruction -will arrange paracentesis with IR -will continue PRN simethicone -will also continue reglan to help with motility -appreciate PC assistance and rec's  7-AKI: no prior records for comparison -assume secondary to pre-renal azotemia and now maybe associated with use of contrast  -will continue gentle IVF's resuscitation -follow renal function trend -no signs of UTI on UA  8-severe protein calorie malnutrition: in setting of acute illness/malignancy -will follow nutritional service recommendations for feeding  supplements   Code Status: DNR Family Communication: wife at bedside Disposition Plan: remains inpatient, continue IV antibiotics X 1 more day, arrange for paracentesis. Appreciate Palliative care assistance and care. Transition to PO abx's in am    Consultants:  Discussed with his primary Oncologist at [facility] (Dr Rushie Nyhan)  [place]  IR (for therapeutic paracentesis)   Procedures:  See below for x-ray reports   Antibiotics:  Vanc 10/06>>10/09  Zosyn 10/06>>10/09  Ciprofloxacin 10/10>>10/20  HPI/Subjective: Has remained Afebrile. Denies CP and breathing is stable. Still with abd distension and no BM  Objective: Filed Vitals:   01/09/15 0554  BP: 125/66  Pulse: 89  Temp: 98.7 F (37.1 C)  Resp: 20    Intake/Output Summary (Last 24 hours) at 01/09/15 1028 Last data filed at 01/09/15 0700  Gross per 24 hour  Intake   1250 ml  Output      0 ml  Net   1250 ml   Filed Weights   01/05/15 1907 01/05/15 2150  Weight: 82.101 kg (181 lb) 84 kg (185 lb 3 oz)    Exam:   General:  Afebrile, no CP, breathing better and no requiring oxygen supplementation. reports still abd distension and pain; which is keeping him in mild distress. Paracentesis unable to be done yesterday Sunday 10/09. Mild icterus on exam.  Cardiovascular: S1 and S2, no rubs, no gallops, no JVD  Respiratory: scattered rhonchi, mild exp wheezing, no rales  Abdomen: soft, distended, positive fluid wave, no guarding, positive BS (but decreased)  Musculoskeletal: no edema, no cyanosis  Data Reviewed: Basic Metabolic Panel:  Recent Labs Lab 01/05/15 1717 01/06/15 0555 01/07/15 0519 01/08/15 0500 01/09/15 0430  NA 135 136 137 140 138  K 3.9 3.7 3.7 3.6 3.3*  CL 107 109 105 111 108  CO2 19* 21* 21*  21* 19*  GLUCOSE 136* 117* 85 91 98  BUN 25* 24* 28* 26* 24*  CREATININE 1.37* 1.39* 1.51* 1.49* 1.59*  CALCIUM 7.7* 7.7* 7.6* 7.7* 7.8*  MG  --   --   --   --  2.3   Liver Function  Tests:  Recent Labs Lab 01/05/15 1717 01/06/15 0555 01/09/15 0430  AST 126* 114* 96*  ALT 129* 116* 90*  ALKPHOS 453* 368* 443*  BILITOT 2.1* 2.0* 1.7*  PROT 6.0* 5.7* 5.7*  ALBUMIN 2.4* 2.1* 2.0*   CBC:  Recent Labs Lab 01/05/15 1717 01/06/15 0555 01/07/15 0519 01/08/15 0500 01/09/15 0430  WBC 16.4* 12.5* 10.2 9.1 11.0*  NEUTROABS 15.3* 9.8*  --   --  8.6*  HGB 9.3* 8.3* 8.0* 8.0* 8.4*  HCT 28.9* 26.0* 24.9* 25.0* 26.4*  MCV 90.6 90.6 90.9 89.6 88.9  PLT 138* 120* 149* 173 221   CBG: No results for input(s): GLUCAP in the last 168 hours.  Recent Results (from the past 240 hour(s))  Blood Culture (routine x 2)     Status: None   Collection Time: 01/05/15  5:05 PM  Result Value Ref Range Status   Specimen Description BLOOD LEFT ANTECUBITAL  Final   Special Requests BOTTLES DRAWN AEROBIC AND ANAEROBIC 5CC  Final   Culture  Setup Time   Final    GRAM NEGATIVE RODS IN BOTH AEROBIC AND ANAEROBIC BOTTLES CRITICAL RESULT CALLED TO, READ BACK BY AND VERIFIED WITH: LIRA @0635  01/06/15 MKELLY    Culture   Final    ENTEROBACTER SAKAZAKII Performed at [facility]    Report Status 01/08/2015 FINAL  Final   Organism ID, Bacteria ENTEROBACTER SAKAZAKII  Final      Susceptibility   Enterobacter sakazakii - MIC*    CEFAZOLIN 16 RESISTANT Resistant     CEFEPIME <=1 SENSITIVE Sensitive     CEFTAZIDIME <=1 SENSITIVE Sensitive     CEFTRIAXONE <=1 SENSITIVE Sensitive     CIPROFLOXACIN <=0.25 SENSITIVE Sensitive     GENTAMICIN <=1 SENSITIVE Sensitive     IMIPENEM <=0.25 SENSITIVE Sensitive     TRIMETH/SULFA <=20 SENSITIVE Sensitive     PIP/TAZO <=4 SENSITIVE Sensitive     * ENTEROBACTER SAKAZAKII  Blood Culture (routine x 2)     Status: None   Collection Time: 01/05/15  5:48 PM  Result Value Ref Range Status   Specimen Description BLOOD LEFT FOREARM  Final   Special Requests BOTTLES DRAWN AEROBIC AND ANAEROBIC 5CC  Final   Culture  Setup Time   Final    GRAM  NEGATIVE RODS IN BOTH AEROBIC AND ANAEROBIC BOTTLES CRITICAL RESULT CALLED TO, READ BACK BY AND VERIFIED WITH: K PHILLIPS,RN AT [place] 01/06/15 BY L BENFIELD    Culture   Final    ENTEROBACTER SAKAZAKII Performed at [facility]    Report Status 01/08/2015 FINAL  Final  Urine culture     Status: None   Collection Time: 01/05/15  7:02 PM  Result Value Ref Range Status   Specimen Description URINE, CLEAN CATCH  Final   Special Requests NONE  Final   Culture   Final    NO GROWTH 1 DAY Performed at [facility]    Report Status 01/06/2015 FINAL  Final     Studies: Ct Abdomen Pelvis W Contrast  01/08/2015   CLINICAL DATA:  Constipation, abdominal distention. Current history of cholangiocarcinoma.  EXAM: CT ABDOMEN AND PELVIS WITH CONTRAST  TECHNIQUE: Multidetector CT imaging of the  abdomen and pelvis was performed using the standard protocol following bolus administration of intravenous contrast.  CONTRAST:  5mL OMNIPAQUE IOHEXOL 300 MG/ML SOLN, 158mL OMNIPAQUE IOHEXOL 300 MG/ML SOLN  COMPARISON:  None.  FINDINGS: Severe degenerative disc disease is noted at L5-S1. Visualized lung bases are unremarkable.  No gallstones are noted, although gallbladder is contracted. Large irregular mass is noted in left hepatic lobe which extends inferiorly toward the pancreas consistent with a history of cholangiocarcinoma. Biliary stent is seen extending from the liver into the duodenum. The spleen appears normal. Moderate ascites is noted. Adrenal glands and kidneys are unremarkable. IVC filter is noted in infrarenal position. Atherosclerosis of abdominal aorta is noted. Multiple omental masses are noted consistent with peritoneal carcinomatosis. Extensive retroperitoneal adenopathy is noted also consistent with metastatic disease. There is no evidence of bowel obstruction. No significant amount of stool is seen throughout the colon. Urinary bladder is decompressed. Moderate prostatic enlargement is  noted.  IMPRESSION: Irregular mass is seen involving the left hepatic lobe consistent with history of cholangiocarcinoma. It extends inferiorly toward the pancreas, but does not appear to definitely involve the pancreas. Biliary stent is in grossly good position. No significant intrahepatic biliary dilatation is seen at this time.  Peritoneal carcinomatosis is noted. Retroperitoneal adenopathy is noted consistent with metastatic disease.  Moderate amount of ascites is noted.  There is no evidence of bowel obstruction. There does not appear to be a significant amount of stool present within the colon.   Electronically Signed   By: Marijo Conception, M.D.   On: 01/08/2015 14:02    Scheduled Meds: . albumin human  50 g Intravenous Q6H  . antiseptic oral rinse  7 mL Mouth Rinse q12n4p  . budesonide (PULMICORT) nebulizer solution  0.25 mg Nebulization BID  . chlorhexidine  15 mL Mouth Rinse BID  . ciprofloxacin  500 mg Oral BID  . clotrimazole  1 application Topical BID  . enoxaparin  85 mg Subcutaneous BID  . finasteride  1 mg Oral Daily  . metoCLOPramide (REGLAN) injection  5 mg Intravenous 3 times per day  . montelukast  10 mg Oral Daily  . sodium chloride  3 mL Intravenous Q12H   Continuous Infusions: . sodium chloride 50 mL/hr at 01/09/15 0556    Principal Problem:   HCAP (healthcare-associated pneumonia) Active Problems:   Anemia   Bilateral pulmonary embolism ([facility])   Cholangiocarcinoma ([facility])   Asthma   Hypertension   Protein-calorie malnutrition, severe   Constipation   Encounter for palliative care   Bacteremia   AKI (acute kidney injury) ([place])   Abdominal distension   Ascites    Time spent: 30 minutes    Barton Dubois  [facility] Pager [phone]. If 7PM-7AM, please contact night-coverage at [email or web address], password [facility] 01/09/2015, 10:28 AM  LOS: 4 days

## 2015-01-10 DIAGNOSIS — D630 Anemia in neoplastic disease: Secondary | ICD-10-CM

## 2015-01-10 LAB — COMPREHENSIVE METABOLIC PANEL
ALBUMIN: 3.3 g/dL — AB (ref 3.5–5.0)
ALK PHOS: 313 U/L — AB (ref 38–126)
ALT: 60 U/L (ref 17–63)
AST: 70 U/L — AB (ref 15–41)
Anion gap: 6 (ref 5–15)
BILIRUBIN TOTAL: 1.8 mg/dL — AB (ref 0.3–1.2)
BUN: 25 mg/dL — AB (ref 6–20)
CALCIUM: 8 mg/dL — AB (ref 8.9–10.3)
CO2: 19 mmol/L — ABNORMAL LOW (ref 22–32)
Chloride: 115 mmol/L — ABNORMAL HIGH (ref 101–111)
Creatinine, Ser: 1.94 mg/dL — ABNORMAL HIGH (ref 0.61–1.24)
GFR calc Af Amer: 39 mL/min — ABNORMAL LOW (ref >60)
GFR calc non Af Amer: 34 mL/min — ABNORMAL LOW (ref >60)
GLUCOSE: 105 mg/dL — AB (ref 65–99)
Potassium: 3.8 mmol/L (ref 3.5–5.1)
Sodium: 140 mmol/L (ref 135–145)
TOTAL PROTEIN: 5.7 g/dL — AB (ref 6.5–8.1)

## 2015-01-10 LAB — ABO/RH: ABO/RH(D): A POS

## 2015-01-10 LAB — CBC
HCT: 22.3 % — ABNORMAL LOW (ref 39.0–52.0)
HEMATOCRIT: 22.1 % — AB (ref 39.0–52.0)
HEMOGLOBIN: 7.1 g/dL — AB (ref 13.0–17.0)
Hemoglobin: 7.1 g/dL — ABNORMAL LOW (ref 13.0–17.0)
MCH: 28.4 pg (ref 26.0–34.0)
MCH: 27.7 pg (ref 26.0–34.0)
MCHC: 32.1 g/dL (ref 30.0–36.0)
MCHC: 31.8 g/dL (ref 30.0–36.0)
MCV: 88.4 fL (ref 78.0–100.0)
MCV: 87.1 fL (ref 78.0–100.0)
PLATELETS: 112 10*3/uL — AB (ref 150–400)
Platelets: 103 10*3/uL — ABNORMAL LOW (ref 150–400)
RBC: 2.5 MIL/uL — ABNORMAL LOW (ref 4.22–5.81)
RBC: 2.56 MIL/uL — ABNORMAL LOW (ref 4.22–5.81)
RDW: 15.9 % — ABNORMAL HIGH (ref 11.5–15.5)
RDW: 16 % — AB (ref 11.5–15.5)
WBC: 10 10*3/uL (ref 4.0–10.5)
WBC: 11.4 10*3/uL — ABNORMAL HIGH (ref 4.0–10.5)

## 2015-01-10 LAB — PREPARE RBC (CROSSMATCH)

## 2015-01-10 MED ORDER — SULFAMETHOXAZOLE-TRIMETHOPRIM 400-80 MG PO TABS
1.0000 | ORAL_TABLET | Freq: Two times a day (BID) | ORAL | Status: DC
  Administered 2015-01-10: 1 via ORAL
  Filled 2015-01-10 (×2): qty 1

## 2015-01-10 MED ORDER — SODIUM CHLORIDE 0.9 % IV SOLN
Freq: Once | INTRAVENOUS | Status: AC
  Administered 2015-01-10: 21:00:00 via INTRAVENOUS

## 2015-01-10 MED ORDER — FUROSEMIDE 10 MG/ML IJ SOLN
20.0000 mg | Freq: Once | INTRAMUSCULAR | Status: AC
  Administered 2015-01-10: 20 mg via INTRAVENOUS
  Filled 2015-01-10: qty 2

## 2015-01-10 NOTE — Progress Notes (Signed)
[facility] PROGRESS NOTE  Terrance Larsen TOI:[id number] DOB: 04-30-1945 DOA: 01/05/2015 PCP: Cari Caraway, MD  Brief summary 69 y.o. male with a past medical history of asthma, hypertension, bilateral pulmonary embolism, recently diagnosed cholangiocarcinoma who comes to the emergency department with complaints of fever and chills for the last 2 days. The patient denies productive cough, but has increased shortness of breath from baseline. He complains of wheezing, but denies earaches, sore throat or rhinorrhea. He denies any known sick contacts, but has been diagnosed and treated at [facility] in [place] and at [place].  Patient found to have bacteremia due to gram neg rods and was in the process of been discharge home after transition to ciprofloxacin. Case complicated now with symptomatic anemia and thrombocytopenia. After discussing with ID again will treat with bactrim for 9 days. On 10/11 order to be transfused 2 units of PRBC's was placed and will follow response/tolerance to bactrim. Please also assess re-accumulation rate of ascites adn need of another paracentesis   Assessment/Plan: 1-SIRS/early sepsis: due to enterococcus sakazakii bacteremia  -patient on presentation with RR of 27, HR 107, temp 101.9 and WBC's 16.4 -positive blood cx's for gram neg rods 2/2 (positive for enterococcus sakazakii)  -CXR with concerns for early PNA, but no coughing and per patient impairment in his breathing due to distended abdomen. Most likely atelectasis  -case discussed with ID; and the plan was for 10 days of ciprofloxacin to complete treatment; given family hx and thrombocytopenia/acute dropped in Hgb after 3 doses of cipro; will changed to bactrim and monitor for tolerance (now only 9 days left of tx).  -continue supportive care -follow clinical response  -will continue flutter valve and pulmonary toiletry (to help with atelectasis); no needing O2 supplementation any longer   -no further fever   2-hx of bilateral PE -continue lovenox, as no signs of overt bleeding -follow Hgb trend after transfusion  -if platelets continue to be on the low side will need to discontinue heparin products    3-cholangiocarcinoma: s/p biliary stent -plan is for initiation of palliative chemotherapy at [facility] after current infection is clear -discussed with patient's primary oncologist; next outpatient appointment has been moved to 01/16/15 -will monitor LFT's  4-hx of asthma: mild intermittent and no complicated -will continue nebulizer treatment -no wheezing currently on exam  5-hx of HTN: diet controlled -BP is stable -will monitor VS  6-constipation/Ascites:  -no BM despite aggressive laxatives treatment -CT scan of his adb done and demonstrating severe ascites, no stool burden and no obstruction -S/P paracentesis by IR on 10/10 (3.8 liter removed); will assess for re-accumulation rate  -will continue PRN simethicone -will continue reglan to help with motility -appreciate PC assistance and rec's  7-AKI: no prior records for comparison -assume secondary to pre-renal azotemia and now maybe associated with use of contrast  -will continue gentle IVF's resuscitation, patient encourage to maintain better hydration as well -follow renal function trend -no signs of UTI on UA -no hydronephrosis on recent CT ab/pelvis   8-severe protein calorie malnutrition: in setting of acute illness/malignancy -will follow nutritional service recommendations for feeding supplements.  9-thrombocytopenia: after use of cipro. Family reprots patient;s side one daughter with specific description of side effects to fluoroquinolones (patient himself until now has never used it before, reason why they didn't mentioned anything earlier)  -discussed with Dr. Linus Salmons and the plan is to complete 9 days with bactrim instead. -follow response and check platelets trend in am  10-symptomatic anemia:  associated to malignancy and potential myelosuppression reaction to use of cipro.  -will transfuse 2 units of PRBC's -check for FOBT -follow Hgb trend    Code Status: DNR Family Communication: wife at bedside Disposition Plan: remains inpatient, antibiotics changed to bactrim; follow response and tolerance. follow BMET and CBC in am. Appreciate Palliative care assistance and care.    Consultants:  Discussed with his primary Oncologist at [facility] (Dr Rushie Nyhan)  [place]  IR (for therapeutic paracentesis)   Procedures:  See below for x-ray reports   Antibiotics:  Vanc 10/06>>10/09  Zosyn 10/06>>10/09  Ciprofloxacin 10/10>>10/20  HPI/Subjective: Has remained Afebrile. Denies CP and breathing is much better. abd better and significantly less distended after paracentesis. Patient with acute development of thrombocytopenia and worsening anemia, now symptomatic.    Objective: Filed Vitals:   01/10/15 1550  BP: 121/71  Pulse: 77  Temp: 99.2 F (37.3 C)  Resp: 18    Intake/Output Summary (Last 24 hours) at 01/10/15 1712 Last data filed at 01/10/15 1500  Gross per 24 hour  Intake   1880 ml  Output      0 ml  Net   1880 ml   Filed Weights   01/05/15 1907 01/05/15 2150  Weight: 82.101 kg (181 lb) 84 kg (185 lb 3 oz)    Exam:   General:  Afebrile, no CP, no SOB and no requiring oxygen supplementation. Abdomen feeling much better after paracentesis. Complaining of been weaker and more tired today. No bruises, no overt bleeding. Mild icterus on exam.  Cardiovascular: S1 and S2, no rubs, no gallops, no JVD  Respiratory: scattered rhonchi, mild exp wheezing, no rales  Abdomen: soft, slightly distended, no guarding, positive BS; some pain to deep palpation in the RUQ area.  Musculoskeletal: no edema, no cyanosis  Data Reviewed: Basic Metabolic Panel:  Recent Labs Lab 01/06/15 0555 01/07/15 0519 01/08/15 0500 01/09/15 0430 01/10/15 0542  NA 136 137  140 138 140  K 3.7 3.7 3.6 3.3* 3.8  CL 109 105 111 108 115*  CO2 21* 21* 21* 19* 19*  GLUCOSE 117* 85 91 98 105*  BUN 24* 28* 26* 24* 25*  CREATININE 1.39* 1.51* 1.49* 1.59* 1.94*  CALCIUM 7.7* 7.6* 7.7* 7.8* 8.0*  MG  --   --   --  2.3  --    Liver Function Tests:  Recent Labs Lab 01/05/15 1717 01/06/15 0555 01/09/15 0430 01/10/15 0542  AST 126* 114* 96* 70*  ALT 129* 116* 90* 60  ALKPHOS 453* 368* 443* 313*  BILITOT 2.1* 2.0* 1.7* 1.8*  PROT 6.0* 5.7* 5.7* 5.7*  ALBUMIN 2.4* 2.1* 2.0* 3.3*   CBC:  Recent Labs Lab 01/05/15 1717 01/06/15 0555 01/07/15 0519 01/08/15 0500 01/09/15 0430 01/10/15 0542 01/10/15 1533  WBC 16.4* 12.5* 10.2 9.1 11.0* 10.0 11.4*  NEUTROABS 15.3* 9.8*  --   --  8.6*  --   --   HGB 9.3* 8.3* 8.0* 8.0* 8.4* 7.1* 7.1*  HCT 28.9* 26.0* 24.9* 25.0* 26.4* 22.1* 22.3*  MCV 90.6 90.6 90.9 89.6 88.9 88.4 87.1  PLT 138* 120* 149* 173 221 103* 112*   CBG: No results for input(s): GLUCAP in the last 168 hours.  Recent Results (from the past 240 hour(s))  Blood Culture (routine x 2)     Status: None   Collection Time: 01/05/15  5:05 PM  Result Value Ref Range Status   Specimen Description BLOOD LEFT ANTECUBITAL  Final   Special Requests BOTTLES DRAWN AEROBIC AND  ANAEROBIC 5CC  Final   Culture  Setup Time   Final    GRAM NEGATIVE RODS IN BOTH AEROBIC AND ANAEROBIC BOTTLES CRITICAL RESULT CALLED TO, READ BACK BY AND VERIFIED WITH: LIRA @0635  01/06/15 MKELLY    Culture   Final    ENTEROBACTER SAKAZAKII Performed at [facility]    Report Status 01/08/2015 FINAL  Final   Organism ID, Bacteria ENTEROBACTER SAKAZAKII  Final      Susceptibility   Enterobacter sakazakii - MIC*    CEFAZOLIN 16 RESISTANT Resistant     CEFEPIME <=1 SENSITIVE Sensitive     CEFTAZIDIME <=1 SENSITIVE Sensitive     CEFTRIAXONE <=1 SENSITIVE Sensitive     CIPROFLOXACIN <=0.25 SENSITIVE Sensitive     GENTAMICIN <=1 SENSITIVE Sensitive     IMIPENEM <=0.25  SENSITIVE Sensitive     TRIMETH/SULFA <=20 SENSITIVE Sensitive     PIP/TAZO <=4 SENSITIVE Sensitive     * ENTEROBACTER SAKAZAKII  Blood Culture (routine x 2)     Status: None   Collection Time: 01/05/15  5:48 PM  Result Value Ref Range Status   Specimen Description BLOOD LEFT FOREARM  Final   Special Requests BOTTLES DRAWN AEROBIC AND ANAEROBIC 5CC  Final   Culture  Setup Time   Final    GRAM NEGATIVE RODS IN BOTH AEROBIC AND ANAEROBIC BOTTLES CRITICAL RESULT CALLED TO, READ BACK BY AND VERIFIED WITH: K PHILLIPS,RN AT [facility] 01/06/15 BY L BENFIELD    Culture   Final    ENTEROBACTER SAKAZAKII Performed at [facility]    Report Status 01/08/2015 FINAL  Final  Urine culture     Status: None   Collection Time: 01/05/15  7:02 PM  Result Value Ref Range Status   Specimen Description URINE, CLEAN CATCH  Final   Special Requests NONE  Final   Culture   Final    NO GROWTH 1 DAY Performed at [facility]    Report Status 01/06/2015 FINAL  Final     Studies: US Paracentesis  01/09/2015   INDICATION: Ascites, request for therapeutic paracentesis.  EXAM: ULTRASOUND-GUIDED PARACENTESIS  COMPARISON:  CT Abdomen/Pelvis 01/08/2015.  MEDICATIONS: None.  COMPLICATIONS: None immediate  TECHNIQUE: Informed written consent was obtained from the patient after a discussion of the risks, benefits and alternatives to treatment. A timeout was performed prior to the initiation of the procedure.  Initial ultrasound scanning demonstrates a large amount of ascites within the left lower abdominal quadrant. The left lower abdomen was prepped and draped in the usual sterile fashion. 1% lidocaine was used for local anesthesia.  Under direct ultrasound guidance, a 19 gauge, 7-cm, Yueh catheter was introduced. An ultrasound image was saved for documentation purposed. The paracentesis was performed. The catheter was removed and a dressing was applied. The patient tolerated the procedure well without immediate  post procedural complication.  FINDINGS: A total of approximately 3.8 liters of serous fluid was removed.  IMPRESSION: Successful ultrasound-guided paracentesis yielding 3.8 liters of peritoneal fluid.  Read By:  Tsosie Billing PA-C   Electronically Signed   By: Lucrezia Europe M.D.   On: 01/09/2015 10:50    Scheduled Meds: . sodium chloride   Intravenous Once  . antiseptic oral rinse  7 mL Mouth Rinse q12n4p  . budesonide (PULMICORT) nebulizer solution  0.25 mg Nebulization BID  . chlorhexidine  15 mL Mouth Rinse BID  . clotrimazole  1 application Topical BID  . enoxaparin  85 mg Subcutaneous BID  . furosemide  20 mg Intravenous Once  . metoCLOPramide (REGLAN) injection  5 mg Intravenous 3 times per day  . montelukast  10 mg Oral Daily  . sodium chloride  3 mL Intravenous Q12H  . sulfamethoxazole-trimethoprim  1 tablet Oral Q12H   Continuous Infusions: . sodium chloride 50 mL/hr at 01/09/15 2318    Principal Problem:   HCAP (healthcare-associated pneumonia) Active Problems:   Anemia   Bilateral pulmonary embolism (HCC)   Cholangiocarcinoma (HCC)   Asthma   Hypertension   Protein-calorie malnutrition, severe   Constipation   Encounter for palliative care   Bacteremia   AKI (acute kidney injury) ([place])   Abdominal distension   Ascites    Time spent: 35 minutes    Barton Dubois  [facility] Pager [phone]. If 7PM-7AM, please contact night-coverage at [email or web address], password [facility] 01/10/2015, 5:12 PM  LOS: 5 days

## 2015-01-10 NOTE — Care Management Note (Signed)
Case Management Note  Patient Details  Name: Jontavious Commons MRN: [id number] Date of Birth: [birth year]  Subjective/Objective:69 y/o m admitted w/HCAP, bacteremia, ascites.s/p paracentesis. Per notes for paracentesis in am by IR. Per nsg no PT cons needed at this time.From home.                   Action/Plan:d/c plan home.   Expected Discharge Date:   (unknown)               Expected Discharge Plan:  [place]  In-House Referral:     Discharge planning Services  CM Consult  Post Acute Care Choice:    Choice offered to:     DME Arranged:    DME Agency:     HH Arranged:    HH Agency:     Status of Service:  In process, will continue to follow  Medicare Important Message Given:    Date Medicare IM Given:    Medicare IM give by:    Date Additional Medicare IM Given:    Additional Medicare Important Message give by:     If discussed at [place] of Stay Meetings, dates discussed:    Additional Comments:  Dessa Phi, RN 01/10/2015, 3:35 PM

## 2015-01-11 DIAGNOSIS — Z515 Encounter for palliative care: Secondary | ICD-10-CM | POA: Insufficient documentation

## 2015-01-11 DIAGNOSIS — J189 Pneumonia, unspecified organism: Secondary | ICD-10-CM

## 2015-01-11 DIAGNOSIS — I2699 Other pulmonary embolism without acute cor pulmonale: Secondary | ICD-10-CM

## 2015-01-11 DIAGNOSIS — R188 Other ascites: Secondary | ICD-10-CM

## 2015-01-11 DIAGNOSIS — R7881 Bacteremia: Secondary | ICD-10-CM

## 2015-01-11 DIAGNOSIS — C221 Intrahepatic bile duct carcinoma: Secondary | ICD-10-CM

## 2015-01-11 LAB — BASIC METABOLIC PANEL
Anion gap: 11 (ref 5–15)
BUN: 26 mg/dL — AB (ref 6–20)
CHLORIDE: 112 mmol/L — AB (ref 101–111)
CO2: 16 mmol/L — AB (ref 22–32)
Calcium: 8.4 mg/dL — ABNORMAL LOW (ref 8.9–10.3)
Creatinine, Ser: 2.14 mg/dL — ABNORMAL HIGH (ref 0.61–1.24)
GFR calc non Af Amer: 30 mL/min — ABNORMAL LOW (ref >60)
GFR, EST AFRICAN AMERICAN: 35 mL/min — AB (ref >60)
Glucose, Bld: 120 mg/dL — ABNORMAL HIGH (ref 65–99)
Potassium: 4 mmol/L (ref 3.5–5.1)
SODIUM: 139 mmol/L (ref 135–145)

## 2015-01-11 MED ORDER — BOOST / RESOURCE BREEZE PO LIQD
1.0000 | Freq: Two times a day (BID) | ORAL | Status: DC
  Administered 2015-01-11: 1 via ORAL

## 2015-01-11 MED ORDER — DEXTROSE 5 % IV SOLN
2.0000 g | INTRAVENOUS | Status: DC
  Administered 2015-01-11 – 2015-01-12 (×2): 2 g via INTRAVENOUS
  Filled 2015-01-11 (×2): qty 2

## 2015-01-11 MED ORDER — AMOXICILLIN-POT CLAVULANATE 600-42.9 MG/5ML PO SUSR
875.0000 mg | Freq: Two times a day (BID) | ORAL | Status: DC
  Administered 2015-01-11: 875 mg via ORAL
  Filled 2015-01-11: qty 7.3

## 2015-01-11 MED ORDER — SODIUM CHLORIDE 0.9 % IV BOLUS (SEPSIS)
1000.0000 mL | Freq: Once | INTRAVENOUS | Status: AC
  Administered 2015-01-11: 1000 mL via INTRAVENOUS

## 2015-01-11 MED ORDER — SULFAMETHOXAZOLE-TRIMETHOPRIM 800-160 MG PO TABS
1.0000 | ORAL_TABLET | Freq: Two times a day (BID) | ORAL | Status: DC
  Filled 2015-01-11: qty 1

## 2015-01-11 MED ORDER — ONDANSETRON 4 MG PO TBDP
4.0000 mg | ORAL_TABLET | Freq: Three times a day (TID) | ORAL | Status: DC
  Administered 2015-01-11 – 2015-01-12 (×2): 4 mg via ORAL
  Filled 2015-01-11 (×3): qty 1

## 2015-01-11 MED ORDER — PROCHLORPERAZINE EDISYLATE 5 MG/ML IJ SOLN: 5.0000 mg | Freq: Four times a day (QID) | INTRAMUSCULAR | Status: DC | PRN

## 2015-01-11 NOTE — Progress Notes (Addendum)
Pharmacy Consult - Antibiotic Renal Dose Adjustment  Assessment: 16 yoM on antibiotics for treatment of Enterobacter bacteremia.  Patient's therapy was narrowed to Cipro but patient had experienced thrombocytopenia following a day of doses so treatment changed to Bactrim 1 SS tablet q12h.  10/6 Vanc >> 10/9 10/6 Zosyn >> 10/10 10/10 Cipro >> 10/11 10/11 Bactrim >>  10/6 blood x2: Enterobacter sakazakii (R ancef only) 10/6 urine: NGF 10/7 urine strep/legionella: neg/neg  SCr increasing, now 2.14.  CrCl~33 ml/min.  Plan: 1.  For CrCl>30 ml/min, will adjust Bactrim to 1 DS tablet q12h.  If SCr continues to increase, will resume single strength tablet dosing.   2.  Pharmacy will continue to monitor SCr and CBC (since Bactrim can cause cytopenias).  Hershal Coria, PharmD, BCPS Pager: [phone] 01/11/2015 7:45 AM   ADDENDUM: 01/11/2015 10:55 AM Pharmacy consulted to place order for Augmentin 875 mg PO BID suspension since patient had GI upset following Bactrim dose last night.  Pharmacy has placed order for Augmentin suspension per MD request.  This dose will require adjustment if SCr continues to increase.  Pharmacy will monitor.  Hershal Coria, PharmD, BCPS Pager: [phone] 01/11/2015 10:56 AM

## 2015-01-11 NOTE — Care Management Note (Signed)
Case Management Note  Patient Details  Name: Ross Bender MRN: [id number] Date of Birth: 02-17-1946  Subjective/Objective: Palliative-recc HHC.Patient/spouse chose [facility]-long term iv abx. For PICC.ID cons.  [facility] IV liason Pam aware of referral(teaching), & rep Cyril Mourning for [facility]HC-HHRN/PT(already ordered)PT to cons once activity status addressed-Nsg notified. Will need iv abx script, & HHRN-iv abx order,labs,picc line flush addressed.                 Action/Plan:d/c plan home w/HHC/iv abx   Expected Discharge Date:   (unknown)               Expected Discharge Plan:  [place]  In-House Referral:     Discharge planning Services  CM Consult  Post Acute Care Choice:    Choice offered to:  Patient  DME Arranged:    DME Agency:     HH Arranged:  RN, PT, IV Antibiotics HH Agency:  [place]  Status of Service:  In process, will continue to follow  Medicare Important Message Given:    Date Medicare IM Given:    Medicare IM give by:    Date Additional Medicare IM Given:    Additional Medicare Important Message give by:     If discussed at [place] of Stay Meetings, dates discussed:    Additional Comments:  Dessa Phi, RN 01/11/2015, 3:28 PM

## 2015-01-11 NOTE — Progress Notes (Signed)
   01/11/15 1600  Clinical Encounter Type  Visited With Patient and family together  Visit Type Other (Comment);Initial (Advance Directive )  Referral From Nurse  Advance Directives (For Healthcare)  Does patient have an advance directive? Yes  Type of Advance Directive Living will;Healthcare Power of Attorney  Does patient want to make changes to advanced directive? No - Patient declined  Copy of advanced directive(s) in chart? Yes   Pt requested information on Advance Directives.  Provided education and materials.  Pt completed Living Will and [place].  Documents Notarized.   Original and two copies given to Pt.  Copy in chart on unit and copy with medical records to be scanned into EPIC

## 2015-01-11 NOTE — Progress Notes (Signed)
Patient has complained of increased Abd pain and cramping after taking his PO bactrim.

## 2015-01-11 NOTE — Progress Notes (Signed)
Daily Progress Note   Patient Name: Terrance Larsen       Date: 01/11/2015 DOB: 1945-04-02  Age: 69 y.o. MRN#: [id number] Attending Physician: Kelvin Cellar, MD Primary Care Physician: Cari Caraway, MD Admit Date: 01/05/2015  Reason for Consultation/Follow-up: GOC  Subjective:     I met today with Mr. & Terrance Larsen. They are very frustrated and miserable here in the hospital. They are very unhappy and becomes very nauseous with po antibiotics (he was dry heaving). They are also very concerned that because of this he has NO appetite and they believe this is due to the antibiotics. He did not tolerate Bactrim and the Augmentin is also upsetting stomach and dry heaving. They are also insistent that they go home tomorrow because he cannot sleep and therefore feel any better while in the hospital. I consulted with Dr. Hilma Favors and Dr. Coralyn Pear and we discussed d/c tomorrow and consideration of PICC placement with IV antibiotics to treat his bacteremia. I will also schedule Zofran ODT TID. I did tell them that I fear if he cannot tolerate po antibiotics I am fearful to the effect he will have from palliative chemotherapy - they still plan to move forward with chemotherapy. I also fear that his poor appetite could be more d/t his cancer - they feel this has happened since the antibiotics been made po.  They are appreciative of our support. Relief from paracentesis. Says constipation is resolved but now main concern is poor appetite. I will follow up tomorrow.    Length of Stay: 6 days  Current Medications: Scheduled Meds:  . amoxicillin-clavulanate  875 mg Oral BID  . antiseptic oral rinse  7 mL Mouth Rinse q12n4p  . budesonide (PULMICORT) nebulizer solution  0.25 mg Nebulization BID  . chlorhexidine  15 mL Mouth Rinse BID  . clotrimazole  1 application Topical BID  . enoxaparin  85 mg Subcutaneous BID  . feeding supplement  1 Container Oral BID BM  . metoCLOPramide (REGLAN) injection  5 mg  Intravenous 3 times per day  . montelukast  10 mg Oral Daily  . sodium chloride  3 mL Intravenous Q12H    Continuous Infusions:    PRN Meds: ipratropium-albuterol, ondansetron **OR** ondansetron (ZOFRAN) IV, polyethylene glycol, simethicone  Palliative Performance Scale: 30%     Vital Signs: BP 140/81 mmHg  Pulse 86  Temp(Src) 98.4 F (36.9 C) (Oral)  Resp 16  Ht 5' 10"  (1.778 m)  Wt 84 kg (185 lb 3 oz)  BMI 26.57 kg/m2  SpO2 98% SpO2: SpO2: 98 % O2 Device: O2 Device: Not Delivered O2 Flow Rate: O2 Flow Rate (L/min): 2 L/min  Intake/output summary:  Intake/Output Summary (Last 24 hours) at 01/11/15 1439 Last data filed at 01/11/15 1025  Gross per 24 hour  Intake 1598.33 ml  Output    425 ml  Net 1173.33 ml   LBM: Last BM Date: 01/08/15 Baseline Weight: Weight: 82.101 kg (181 lb) Most recent weight: Weight: 84 kg (185 lb 3 oz)  Physical Exam: General: Appears exhausted, dry heaving Resp: No labored breathing Abd: Slightly distended Neuro: Awake, alert, oriented x 3   Additional Data Reviewed: Recent Labs     01/10/15  0542  01/10/15  1533  01/11/15  0523  WBC  10.0  11.4*  15.6*  HGB  7.1*  7.1*  10.7*  PLT  103*  112*  129*  NA  140   --   139  BUN  25*   --  26*  CREATININE  1.94*   --   2.14*     Problem List:  Patient Active Problem List   Diagnosis Date Noted  . Abdominal distension   . Ascites   . Constipation   . Encounter for palliative care   . Bacteremia   . AKI (acute kidney injury) ([place])   . Protein-calorie malnutrition, severe 01/06/2015  . HCAP (healthcare-associated pneumonia) 01/05/2015  . Anemia 01/05/2015  . Bilateral pulmonary embolism ([place]) 01/05/2015  . Cholangiocarcinoma ([place]) 01/05/2015  . Asthma 01/05/2015  . Hypertension 01/05/2015     Palliative Care Assessment & Plan    Code Status:  DNR  Goals of Care:  They want to pursue palliative chemotherapy and plan f/u with onc at [facility] on Monday appt.    3. Symptom Management:  Nausea/poor appetite: Transition antibiotics to IV. Zofran ODT TID.  Constipation: resolved  Ascites: Discussed benefit/use of peritoneal drain placement with recurrent ascites.   4. Prognosis: < 6 months.   5. Discharge Planning: Home with home health to manage IV antibiotics. Recommend f/u with hospice support - did not discuss hospice today.    Thank you for allowing the Palliative Medicine Team to assist in the care of this patient.   Time In: 1400 Time Out: 1500 Total Time 24mn Prolonged Time Billed  no     Greater than 50%  of this time was spent counseling and coordinating care related to the above assessment and plan.     AVinie Sill NP Palliative Medicine Team Pager # [phone](M-F 8a-5p) Team Phone # [phone](Nights/Weekends)  01/11/2015, 2:39 PM

## 2015-01-11 NOTE — Progress Notes (Signed)
Nutrition Follow-up  DOCUMENTATION CODES:   Severe malnutrition in context of acute illness/injury  INTERVENTION:  - Will order Boost Breeze BID, each supplement provides 250 kcal and 9 grams of protein - RD will continue to monitor for needs  NUTRITION DIAGNOSIS:   Inadequate oral intake related to acute illness, altered GI function as evidenced by per patient/family report, meal completion < 25%. -intermittently improving  GOAL:   Patient will meet greater than or equal to 90% of their needs -unmet on average  MONITOR:   PO intake, Weight trends, Labs, I & O's  ASSESSMENT:   69 y.o. male with a past medical history of asthma, hypertension, bilateral pulmonary embolism, recently diagnosed cholangiocarcinoma who comes to the emergency department with complaints of fever and chills for the last 2 days. The patient denies productive cough, but has increased shortness of breath from baseline. He complains of wheezing, but denies earaches, sore throat or rhinorrhea. He denies any known sick contacts, but has been diagnosed and treated at [facility] in [place] and at [place]. He is supposed to start chemotherapy on Monday. (01/09/15)  10/12 Per chart review, pt consumed 50% of breakfast and lunch 10/10 and 60% breakfast and 100% lunch yesterday (10/11). Pt states he has been unable to eat breakfast this AM. He states feeling in stomach is a mixture of indigestion, pain/pressure, and feeling of fullness despite not eating this AM. Noted that scheduled Reglan order in place. Wife, who is at bedside, offers to get [place] ice for pt but he declines at this time. He states he will try to consume something later today.   Per chart, pt had paracentesis 01/09/15 with 3.8 L fluid removed. Also per review, Palliative following pt and states possibly home with hospice.   Pt not meeting needs on average. Will order supplement and assess for tolerance/acceptance. Medications reviewed.  Labs reviewed; Cl: 112 mmol/L, BUN/creatinine elevated, Ca: 8.4 mg/dL, GFR: 30.    10/7 - Pt reports that for the past 3 weeks he has felt very constipated and very full. Due to this, he has only been able to take a few bites at meals. Even these few bites make this full feeling more pronounced but does not cause nausea. He states that it is not the type of food, but the quantity, that causes this sensation.  - Noted that pt had have consumed iced tea on lunch tray. Pt states that he often takes several sips of fluids with each bite of food. Encouraged him to drink fluids between meals rather than with meals to encourage greater intakes of solid foods. Pt states that this makes sense to him and he will try to do this with subsequent meals.  - Pt reports 10-15 lb weight loss over the past 3 weeks; he states no weight loss prior to this time frame. This indicates 5-7.5% body weight in 3 weeks which is significant for time frame.   Diet Order:  Diet Heart Room service appropriate?: Yes; Fluid consistency:: Thin  Skin:  Reviewed, no issues  Last BM:  10/9  Height:   Ht Readings from Last 1 Encounters:  01/05/15 5\' 10"  (1.778 m)    Weight:   Wt Readings from Last 1 Encounters:  01/05/15 185 lb 3 oz (84 kg)    Ideal Body Weight:  75.45 kg (kg)  BMI:  Body mass index is 26.57 kg/(m^2).  Estimated Nutritional Needs:   Kcal:  2350-2500  Protein:  85-95 grams  Fluid:  1.8-2 L/day  EDUCATION NEEDS:   No education needs identified at this time     Terrance Larsen, [place], [facility] Inpatient Clinical Dietitian Pager # [phone] After hours/weekend pager # [phone]

## 2015-01-11 NOTE — Progress Notes (Signed)
[facility] PROGRESS NOTE  Terrance Larsen WYO:[id number] DOB: [birth year] DOA: 01/05/2015 PCP: Cari Caraway, MD  Brief summary 69 y.o. male with a past medical history of asthma, hypertension, bilateral pulmonary embolism, recently diagnosed cholangiocarcinoma who comes to the emergency department with complaints of fever and chills for the last 2 days. The patient denies productive cough, but has increased shortness of breath from baseline. He complains of wheezing, but denies earaches, sore throat or rhinorrhea. He denies any known sick contacts, but has been diagnosed and treated at [facility] in [place] and at [place].  Patient found to have bacteremia due to gram neg rods and was in the process of been discharge home after transition to ciprofloxacin. Case complicated now with symptomatic anemia and thrombocytopenia. After discussing with ID again will treat with bactrim for 9 days. On 10/11 order to be transfused 2 units of PRBC's was placed and will follow response/tolerance to bactrim. Please also assess re-accumulation rate of ascites adn need of another paracentesis   Assessment/Plan: 1-SIRS/early sepsis: due to enterococcus sakazakii bacteremia  -patient on presentation with RR of 27, HR 107, temp 101.9 and WBC's 16.4 -positive blood cx's for gram neg rods 2/2 (positive for enterococcus sakazakii)  -Suspect source of infection to be from GI tract given history of cholangiocarcinoma -Patient was unable to tolerate Bactrim that was started yesterday complaining of multiple episodes of nausea and vomiting. Prior to this he did not tolerate by mouth ciprofloxacin. -I discussed case with infectious disease who agreed with a 7 day course of ceftriaxone IV every 24 hours -Will place a PICC line anticipate discharge tomorrow. -Home health orders for home RN to assist with infusion therapy has been ordered. -Anticipated stop date for ceftriaxone is Tuesday,  01/17/2015.  2-hx of bilateral PE -continue lovenox, as no signs of overt bleeding -Hemoglobin is stable at 10.7  3-cholangiocarcinoma: s/p biliary stent -plan is for initiation of palliative chemotherapy at [facility] after current infection is clear -discussed with patient's primary oncologist; next outpatient appointment has been moved to 01/16/15 -will monitor LFT's  4-hx of asthma: mild intermittent -will continue nebulizer treatment -no wheezing currently on exam  5-hx of HTN: diet controlled -BP is stable -will monitor VS   8-severe protein calorie malnutrition: in setting of acute illness/malignancy -will follow nutritional service recommendations for feeding supplements.  9-thrombocytopenia: after use of cipro. Family reprots patient;s side one daughter with specific description of side effects to fluoroquinolones (patient himself until now has never used it before, reason why they didn't mentioned anything earlier)  -A.m. lab work showing a platelet count of 129,000  10-symptomatic anemia: associated to malignancy and potential myelosuppression reaction to use of cipro.  -He was transfused 2 units of packed red blood cells on 01/10/2015 with repeat labs showing hemoglobin of 10.7, improved from 7.1  Code Status: DNR Family Communication: I spoke with his wife was present at bedside Disposition Plan: Plan for discharge home the next 24 hours, PICC line ordered will receive an anticipated 7 days of IV ceftriaxone 1 g every 24 hours   Consultants:  Discussed with his primary Oncologist at [facility] (Dr Rushie Nyhan)  [place]  IR (for therapeutic paracentesis)   Procedures:  See below for x-ray reports   Antibiotics:  Vanc 10/06>>10/09  Zosyn 10/06>>10/09  Ciprofloxacin 10/10>>10/20  HPI/Subjective: Patient reports having a rough night describing several episodes of nausea and vomiting after taking Bactrim    Objective: Filed Vitals:   01/11/15 1411   BP: 140/81  Pulse: 86  Temp: 98.4 F (36.9 C)  Resp: 16    Intake/Output Summary (Last 24 hours) at 01/11/15 1511 Last data filed at 01/11/15 1025  Gross per 24 hour  Intake   1185 ml  Output    425 ml  Net    760 ml   Filed Weights   01/05/15 1907 01/05/15 2150  Weight: 82.101 kg (181 lb) 84 kg (185 lb 3 oz)    Exam:   General:  Afebrile, he is awake and alert, reports having a rough night  Cardiovascular: S1 and S2, no rubs, no gallops, no JVD  Respiratory: scattered rhonchi, mild exp wheezing, no rales  Abdomen: soft, slightly distended, no guarding, positive BS; some pain to deep palpation in the RUQ area.  Musculoskeletal: no edema, no cyanosis  Data Reviewed: Basic Metabolic Panel:  Recent Labs Lab 01/07/15 0519 01/08/15 0500 01/09/15 0430 01/10/15 0542 01/11/15 0523  NA 137 140 138 140 139  K 3.7 3.6 3.3* 3.8 4.0  CL 105 111 108 115* 112*  CO2 21* 21* 19* 19* 16*  GLUCOSE 85 91 98 105* 120*  BUN 28* 26* 24* 25* 26*  CREATININE 1.51* 1.49* 1.59* 1.94* 2.14*  CALCIUM 7.6* 7.7* 7.8* 8.0* 8.4*  MG  --   --  2.3  --   --    Liver Function Tests:  Recent Labs Lab 01/05/15 1717 01/06/15 0555 01/09/15 0430 01/10/15 0542  AST 126* 114* 96* 70*  ALT 129* 116* 90* 60  ALKPHOS 453* 368* 443* 313*  BILITOT 2.1* 2.0* 1.7* 1.8*  PROT 6.0* 5.7* 5.7* 5.7*  ALBUMIN 2.4* 2.1* 2.0* 3.3*   CBC:  Recent Labs Lab 01/05/15 1717 01/06/15 0555  01/08/15 0500 01/09/15 0430 01/10/15 0542 01/10/15 1533 01/11/15 0523  WBC 16.4* 12.5*  < > 9.1 11.0* 10.0 11.4* 15.6*  NEUTROABS 15.3* 9.8*  --   --  8.6*  --   --   --   HGB 9.3* 8.3*  < > 8.0* 8.4* 7.1* 7.1* 10.7*  HCT 28.9* 26.0*  < > 25.0* 26.4* 22.1* 22.3* 32.8*  MCV 90.6 90.6  < > 89.6 88.9 88.4 87.1 87.7  PLT 138* 120*  < > 173 221 103* 112* 129*  < > = values in this interval not displayed. CBG: No results for input(s): GLUCAP in the last 168 hours.  Recent Results (from the past 240 hour(s))   Blood Culture (routine x 2)     Status: None   Collection Time: 01/05/15  5:05 PM  Result Value Ref Range Status   Specimen Description BLOOD LEFT ANTECUBITAL  Final   Special Requests BOTTLES DRAWN AEROBIC AND ANAEROBIC 5CC  Final   Culture  Setup Time   Final    GRAM NEGATIVE RODS IN BOTH AEROBIC AND ANAEROBIC BOTTLES CRITICAL RESULT CALLED TO, READ BACK BY AND VERIFIED WITH: LIRA @0635  01/06/15 MKELLY    Culture   Final    ENTEROBACTER SAKAZAKII Performed at [facility]    Report Status 01/08/2015 FINAL  Final   Organism ID, Bacteria ENTEROBACTER SAKAZAKII  Final      Susceptibility   Enterobacter sakazakii - MIC*    CEFAZOLIN 16 RESISTANT Resistant     CEFEPIME <=1 SENSITIVE Sensitive     CEFTAZIDIME <=1 SENSITIVE Sensitive     CEFTRIAXONE <=1 SENSITIVE Sensitive     CIPROFLOXACIN <=0.25 SENSITIVE Sensitive     GENTAMICIN <=1 SENSITIVE Sensitive     IMIPENEM <=0.25 SENSITIVE Sensitive  TRIMETH/SULFA <=20 SENSITIVE Sensitive     PIP/TAZO <=4 SENSITIVE Sensitive     * ENTEROBACTER SAKAZAKII  Blood Culture (routine x 2)     Status: None   Collection Time: 01/05/15  5:48 PM  Result Value Ref Range Status   Specimen Description BLOOD LEFT FOREARM  Final   Special Requests BOTTLES DRAWN AEROBIC AND ANAEROBIC 5CC  Final   Culture  Setup Time   Final    GRAM NEGATIVE RODS IN BOTH AEROBIC AND ANAEROBIC BOTTLES CRITICAL RESULT CALLED TO, READ BACK BY AND VERIFIED WITH: K PHILLIPS,RN AT 0946 01/06/15 BY L BENFIELD    Culture   Final    ENTEROBACTER SAKAZAKII Performed at [facility]    Report Status 01/08/2015 FINAL  Final  Urine culture     Status: None   Collection Time: 01/05/15  7:02 PM  Result Value Ref Range Status   Specimen Description URINE, CLEAN CATCH  Final   Special Requests NONE  Final   Culture   Final    NO GROWTH 1 DAY Performed at [facility]    Report Status 01/06/2015 FINAL  Final     Studies: No results  found.  Scheduled Meds: . antiseptic oral rinse  7 mL Mouth Rinse q12n4p  . budesonide (PULMICORT) nebulizer solution  0.25 mg Nebulization BID  . cefTRIAXone (ROCEPHIN)  IV  2 g Intravenous Q24H  . chlorhexidine  15 mL Mouth Rinse BID  . clotrimazole  1 application Topical BID  . enoxaparin  85 mg Subcutaneous BID  . feeding supplement  1 Container Oral BID BM  . metoCLOPramide (REGLAN) injection  5 mg Intravenous 3 times per day  . montelukast  10 mg Oral Daily  . sodium chloride  3 mL Intravenous Q12H   Continuous Infusions:    Principal Problem:   HCAP (healthcare-associated pneumonia) Active Problems:   Anemia   Bilateral pulmonary embolism (HCC)   Cholangiocarcinoma (HCC)   Asthma   Hypertension   Protein-calorie malnutrition, severe   Constipation   Encounter for palliative care   Bacteremia   AKI (acute kidney injury) ([place])   Abdominal distension   Ascites    Time spent: 35 minutes    Kelvin Cellar  [facility] Pager [phone]. If 7PM-7AM, please contact night-coverage at [email or web address], password [facility] 01/11/2015, 3:11 PM  LOS: 6 days

## 2015-01-12 DIAGNOSIS — R112 Nausea with vomiting, unspecified: Secondary | ICD-10-CM

## 2015-01-12 DIAGNOSIS — R140 Abdominal distension (gaseous): Secondary | ICD-10-CM

## 2015-01-12 DIAGNOSIS — N179 Acute kidney failure, unspecified: Secondary | ICD-10-CM

## 2015-01-12 LAB — BASIC METABOLIC PANEL
Anion gap: 10 (ref 5–15)
BUN: 30 mg/dL — AB (ref 6–20)
CHLORIDE: 113 mmol/L — AB (ref 101–111)
CO2: 16 mmol/L — AB (ref 22–32)
Calcium: 8.1 mg/dL — ABNORMAL LOW (ref 8.9–10.3)
Creatinine, Ser: 2.24 mg/dL — ABNORMAL HIGH (ref 0.61–1.24)
GFR calc Af Amer: 33 mL/min — ABNORMAL LOW (ref >60)
GFR calc non Af Amer: 28 mL/min — ABNORMAL LOW (ref >60)
GLUCOSE: 96 mg/dL (ref 65–99)
POTASSIUM: 4 mmol/L (ref 3.5–5.1)
Sodium: 139 mmol/L (ref 135–145)

## 2015-01-12 LAB — TYPE AND SCREEN
ABO/RH(D): A POS
Antibody Screen: NEGATIVE
UNIT DIVISION: 0
Unit division: 0

## 2015-01-12 LAB — CBC
HEMATOCRIT: 32.8 % — AB (ref 39.0–52.0)
HEMATOCRIT: 33.9 % — AB (ref 39.0–52.0)
HEMOGLOBIN: 10.7 g/dL — AB (ref 13.0–17.0)
HEMOGLOBIN: 11.1 g/dL — AB (ref 13.0–17.0)
MCH: 28.6 pg (ref 26.0–34.0)
MCH: 29.1 pg (ref 26.0–34.0)
MCHC: 32.6 g/dL (ref 30.0–36.0)
MCHC: 32.7 g/dL (ref 30.0–36.0)
MCV: 87.7 fL (ref 78.0–100.0)
MCV: 88.7 fL (ref 78.0–100.0)
Platelets: 129 10*3/uL — ABNORMAL LOW (ref 150–400)
Platelets: 139 10*3/uL — ABNORMAL LOW (ref 150–400)
RBC: 3.74 MIL/uL — AB (ref 4.22–5.81)
RBC: 3.82 MIL/uL — AB (ref 4.22–5.81)
RDW: 15.7 % — ABNORMAL HIGH (ref 11.5–15.5)
RDW: 16.4 % — ABNORMAL HIGH (ref 11.5–15.5)
WBC: 15.6 10*3/uL — ABNORMAL HIGH (ref 4.0–10.5)
WBC: 18.3 10*3/uL — ABNORMAL HIGH (ref 4.0–10.5)

## 2015-01-12 MED ORDER — ONDANSETRON 4 MG PO TBDP: 4.0000 mg | ORAL_TABLET | Freq: Three times a day (TID) | ORAL | Status: AC

## 2015-01-12 MED ORDER — DEXTROSE 5 % IV SOLN: 2.0000 g | INTRAVENOUS | Status: AC

## 2015-01-12 MED ORDER — SODIUM CHLORIDE 0.9 % IJ SOLN: 10.0000 mL | INTRAMUSCULAR | Status: DC | PRN

## 2015-01-12 MED ORDER — HEPARIN SOD (PORK) LOCK FLUSH 100 UNIT/ML IV SOLN
250.0000 [IU] | INTRAVENOUS | Status: AC | PRN
  Administered 2015-01-12: 250 [IU]

## 2015-01-12 NOTE — Care Management Note (Signed)
Case Management Note  Patient Details  Name: Hulet Ehrmann MRN: [id number] Date of Birth: [birth year]  Subjective/Objective: [facility] IV liason-Pam already set for d/c home w/ongoing teaching IV abx.[facility] rep Cyril Mourning- aware of d/c & HHRN iv abx.                  Action/Plan:d/c home w/HHC.   Expected Discharge Date:   (unknown)               Expected Discharge Plan:  [place]  In-House Referral:     Discharge planning Services  CM Consult  Post Acute Care Choice:    Choice offered to:  Patient  DME Arranged:    DME Agency:     HH Arranged:  RN, PT, IV Antibiotics HH Agency:  [facility]  Status of Service:  Completed, signed off  Medicare Important Message Given:    Date Medicare IM Given:    Medicare IM give by:    Date Additional Medicare IM Given:    Additional Medicare Important Message give by:     If discussed at [place] of Stay Meetings, dates discussed:    Additional Comments:  Dessa Phi, RN 01/12/2015, 11:51 AM

## 2015-01-12 NOTE — Discharge Summary (Signed)
Physician Discharge Summary  Terrance Larsen ENI:[id number] DOB: [birth year] DOA: 01/05/2015  PCP: Cari Caraway, MD  Admit date: 01/05/2015 Discharge date: 01/12/2015  Time spent: 35 minutes  Recommendations for Outpatient Follow-up:  1. Please follow up on a CBC and BMP in 3 days, labs to be drawn by [facility], results to be faxed to his Oncologist Dr Lucious Groves at [facility] [place]. Although he was afebrile, hemodynamically stable, had white count of 18,300 on 01/12/2015. Patient expressed his wishes to go home on this date with home IV antimicrobial therapy.  2. He was set up with [place], plan to discharge on a 7 day course of Ceftriaxone 2 g IV q 24 hours. Anticipated stop date 01/17/2015 3. He was instructed to follow up with his Oncologist at [facility] within 1 week.   Discharge Diagnoses:  Principal Problem:   HCAP (healthcare-associated pneumonia) Active Problems:   Anemia   Bilateral pulmonary embolism (HCC)   Cholangiocarcinoma (HCC)   Asthma   Hypertension   Protein-calorie malnutrition, severe   Constipation   Encounter for palliative care   Bacteremia   AKI (acute kidney injury) ([place])   Abdominal distension   Ascites   Palliative care encounter   Discharge Condition: Stable  Diet recommendation: Regular   Filed Weights   01/05/15 1907 01/05/15 2150  Weight: 82.101 kg (181 lb) 84 kg (185 lb 3 oz)    History of present illness:  Terrance Larsen is a 69 y.o. male with a past medical history of asthma, hypertension, bilateral pulmonary embolism, recently diagnosed cholangiocarcinoma who comes to the emergency department with complaints of fever and chills for the last 2 days. The patient denies productive cough, but has increased shortness of breath from baseline. He complains of wheezing, but denies earaches, sore throat or rhinorrhea. He denies any known sick contacts, but has been diagnosed and treated at  [facility] in [place] and at [place]. He is supposed to start chemotherapy on Monday.  Patient also complains of severe constipation for the past 3 weeks, which has produced abdominal distention and is impairing his breathing. He has occasionally mild nausea, but denies emesis.  Hospital Course:  Patient is a pleasant 69 year old gentleman with a history of hypertension, pulmonary embolism, admitted to medicine service on 01/05/2015 presenting with complaints of abdominal distention associate with shortness of breath. He also reported nausea denied emesis. He also presented with a temperature 101.9 with heart rate of 107 and respiratory rate of 27 having sepsis criteria. He was recently diagnosed with cholangiocarcinoma in [place] and transferred his care to [facility] where he was seen Dr.Desnoyers of medical oncology. Plans have been made to initiate chemotherapy. On 01/09/2015 he underwent paracentesis, procedure performed by interventional radiology. Approximately 3.8 L of ascitic fluid was removed. From an infectious disease standpoint his blood cultures drawn on 01/05/2015 through Enterobacter sakazakii, organism resistant to cefazolin, susceptible to Bactrim, Zosyn, ciprofloxacin, ceftriaxone, cefepime. He was initially treated with vancomycin and Zosyn then transitioned to ciprofloxacin on 01/09/2015. After initiation of ciprofloxacin a became thrombocytopenic and anemic. It was felt that this represented adverse drug reaction for which is ciprofloxacin was changed to Bactrim. He did not tolerate Bactrim therapy developing multiple episodes of nausea and vomiting. We attempted Augmentin and had similar symptoms. Patient is wife expressed interest to be treated with IV antibiotic therapy rather than pursuing further oral antimicrobials. A PICC line was placed on 01/12/2015. I discussed antimicrobial therapy with Dr.  Comer of infectious disease who recommended treating with  ceftriaxone. Plan to treat with an additional 7 days of ceftriaxone 2 g IV every 24 hours with anticipated stop date on 01/17/2015. Patient had reported feeling better after stopping oral antibiotic therapy. He was tolerating by mouth and remained afebrile. I noticed a trend his white count 18,300, however, he insisted on being discharged home today on IV antibiotic therapy. Spoke with home health services requesting a BMP and CBC be checked in 3 days. Lab work should be faxed to his primary oncologist at [facility]. During this hospitalization he was seen and evaluated by palliative care. Goals of care were discussed as he expresses his desire to start chemotherapy at [facility].   Discharge Exam: Filed Vitals:   01/12/15 0749  BP:   Pulse: 89  Temp:   Resp: 18     General: Afebrile, he is awake and alert, reported feeling better  Cardiovascular: S1 and S2, no rubs, no gallops, no JVD  Respiratory: scattered rhonchi, mild exp wheezing, no rales  Abdomen: soft, slightly distended, no guarding, positive BS; some pain to deep palpation in the RUQ area.  Musculoskeletal: no edema, no cyanosis  Discharge Instructions   Discharge Instructions    Call MD for:  difficulty breathing, headache or visual disturbances    Complete by:  As directed      Call MD for:  extreme fatigue    Complete by:  As directed      Call MD for:  hives    Complete by:  As directed      Call MD for:  persistant dizziness or light-headedness    Complete by:  As directed      Call MD for:  persistant nausea and vomiting    Complete by:  As directed      Call MD for:  redness, tenderness, or signs of infection (pain, swelling, redness, odor or green/yellow discharge around incision site)    Complete by:  As directed      Call MD for:  severe uncontrolled pain    Complete by:  As directed      Call MD for:  temperature >100.4    Complete by:  As directed      Call MD for:     Complete by:  As directed      Diet - low sodium heart healthy    Complete by:  As directed      Increase activity slowly    Complete by:  As directed           Current Discharge Medication List    START taking these medications   Details  cefTRIAXone 2 g in dextrose 5 % 50 mL Inject 2 g into the vein daily. Qty: 1 Bottle, Refills: 0    ondansetron (ZOFRAN-ODT) 4 MG disintegrating tablet Take 1 tablet (4 mg total) by mouth 3 (three) times daily. Qty: 20 tablet, Refills: 0      CONTINUE these medications which have NOT CHANGED   Details  clotrimazole (LOTRIMIN) 1 % cream Apply 1 application topically 2 (two) times daily.    dexamethasone (DECADRON) 4 MG tablet Take 8 mg by mouth daily as needed. Take for three days after chemo    docusate sodium (COLACE) 100 MG capsule Take 100 mg by mouth 3 (three) times daily.    DULERA 200-5 MCG/ACT AERO Inhale 2 puffs into the lungs 2 (two) times daily.  Refills: 2  enoxaparin (LOVENOX) 100 MG/ML injection Inject 0.85 mLs into the skin 2 (two) times daily. For thirty days start 9/19 Refills: 11    finasteride (PROPECIA) 1 MG tablet Take 1 mg by mouth daily.    montelukast (SINGULAIR) 10 MG tablet Take 10 mg by mouth daily.    SENNA LAX 8.6 MG tablet Take 2 tablets by mouth every evening. Refills: 0      STOP taking these medications     omalizumab (XOLAIR) 150 MG injection      ondansetron (ZOFRAN) 8 MG tablet      PRESCRIPTION MEDICATION        Allergies  Allergen Reactions  . Amlodipine Swelling    'ankles'  . Hctz [Hydrochlorothiazide] Rash   Follow-up Information    Follow up with [facility], MD In 1 week.   Specialty:  Family Medicine   Contact information:   [place] [address] [phone]       Follow up with Jocelyn Lamer, MD In 1 week.   Specialty:  Hematology and Oncology   Contact information:   [place] [address] [phone]         The results of significant diagnostics from this hospitalization (including imaging, microbiology, ancillary and laboratory) are listed below for reference.    Significant Diagnostic Studies: Dg Chest 2 View  01/05/2015  CLINICAL DATA:  Dry cough and fever for 2 days. Recently diagnosed with cholangiocarcinoma. EXAM: CHEST  2 VIEW COMPARISON:  None. FINDINGS: Study is somewhat hypoinspiratory with crowding of the perihilar and lower lobe bronchovascular markings. Streaky opacity within the right lower lobe is of uncertain etiology, probably atelectasis but possibly an early developing pneumonia. No pleural effusion seen. No pneumothorax. Heart size is normal. No significant osseous abnormality seen. IMPRESSION: 1. Streaky opacity within the right lower lung is of uncertain etiology or significance, probably atelectasis but early developing pneumonia cannot be excluded. Recommend follow-up chest x-ray in 2-4 weeks to ensure resolution. 2. Otherwise unremarkable chest x-ray, as detailed above. Electronically Signed   By: Franki Cabot M.D.   On: 01/05/2015 17:37   Dg Abd 1 View  01/05/2015  CLINICAL DATA:  Pt complains of dry cough and fever x 2 days. Pt was recently diagnosed with cholangiocarcinoma. Hx of HTN. Pt also complains of constipation and abdominal distension x 2 weeks. EXAM: ABDOMEN - 1 VIEW COMPARISON:  None. FINDINGS: Plastic biliary stent projects in expected location. Retrievable IVC filter at the L2-3 level. Multiple gas filled nondilated small bowel loops in the mid abdomen. The colon and rectum are nondilated. No abnormal abdominal calcifications. Subchondral sclerosis in the right acetabulum. IMPRESSION: 1. Mild small bowel gaseous distension without evidence of obstruction. 2. Biliary stent an IVC filter. Electronically Signed   By: Lucrezia Europe M.D.   On: 01/05/2015 17:37   Ct Abdomen Pelvis W Contrast  01/08/2015  CLINICAL DATA:  Constipation, abdominal distention. Current  history of cholangiocarcinoma. EXAM: CT ABDOMEN AND PELVIS WITH CONTRAST TECHNIQUE: Multidetector CT imaging of the abdomen and pelvis was performed using the standard protocol following bolus administration of intravenous contrast. CONTRAST:  20mL OMNIPAQUE IOHEXOL 300 MG/ML SOLN, 123mL OMNIPAQUE IOHEXOL 300 MG/ML SOLN COMPARISON:  None. FINDINGS: Severe degenerative disc disease is noted at L5-S1. Visualized lung bases are unremarkable. No gallstones are noted, although gallbladder is contracted. Large irregular mass is noted in left hepatic lobe which extends inferiorly toward the pancreas consistent with a history of cholangiocarcinoma. Biliary stent is  seen extending from the liver into the duodenum. The spleen appears normal. Moderate ascites is noted. Adrenal glands and kidneys are unremarkable. IVC filter is noted in infrarenal position. Atherosclerosis of abdominal aorta is noted. Multiple omental masses are noted consistent with peritoneal carcinomatosis. Extensive retroperitoneal adenopathy is noted also consistent with metastatic disease. There is no evidence of bowel obstruction. No significant amount of stool is seen throughout the colon. Urinary bladder is decompressed. Moderate prostatic enlargement is noted. IMPRESSION: Irregular mass is seen involving the left hepatic lobe consistent with history of cholangiocarcinoma. It extends inferiorly toward the pancreas, but does not appear to definitely involve the pancreas. Biliary stent is in grossly good position. No significant intrahepatic biliary dilatation is seen at this time. Peritoneal carcinomatosis is noted. Retroperitoneal adenopathy is noted consistent with metastatic disease. Moderate amount of ascites is noted. There is no evidence of bowel obstruction. There does not appear to be a significant amount of stool present within the colon. Electronically Signed   By: Marijo Conception, M.D.   On: 01/08/2015 14:02   US Paracentesis  01/09/2015   INDICATION: Ascites, request for therapeutic paracentesis. EXAM: ULTRASOUND-GUIDED PARACENTESIS COMPARISON:  CT Abdomen/Pelvis 01/08/2015. MEDICATIONS: None. COMPLICATIONS: None immediate TECHNIQUE: Informed written consent was obtained from the patient after a discussion of the risks, benefits and alternatives to treatment. A timeout was performed prior to the initiation of the procedure. Initial ultrasound scanning demonstrates a large amount of ascites within the left lower abdominal quadrant. The left lower abdomen was prepped and draped in the usual sterile fashion. 1% lidocaine was used for local anesthesia. Under direct ultrasound guidance, a 19 gauge, 7-cm, Yueh catheter was introduced. An ultrasound image was saved for documentation purposed. The paracentesis was performed. The catheter was removed and a dressing was applied. The patient tolerated the procedure well without immediate post procedural complication. FINDINGS: A total of approximately 3.8 liters of serous fluid was removed. IMPRESSION: Successful ultrasound-guided paracentesis yielding 3.8 liters of peritoneal fluid. Read By:  Tsosie Billing PA-C Electronically Signed   By: Lucrezia Europe M.D.   On: 01/09/2015 10:50    Microbiology: Recent Results (from the past 240 hour(s))  Blood Culture (routine x 2)     Status: None   Collection Time: 01/05/15  5:05 PM  Result Value Ref Range Status   Specimen Description BLOOD LEFT ANTECUBITAL  Final   Special Requests BOTTLES DRAWN AEROBIC AND ANAEROBIC 5CC  Final   Culture  Setup Time   Final    GRAM NEGATIVE RODS IN BOTH AEROBIC AND ANAEROBIC BOTTLES CRITICAL RESULT CALLED TO, READ BACK BY AND VERIFIED WITH: LIRA @0635  01/06/15 MKELLY    Culture   Final    ENTEROBACTER SAKAZAKII Performed at [facility]    Report Status 01/08/2015 FINAL  Final   Organism ID, Bacteria ENTEROBACTER SAKAZAKII  Final      Susceptibility   Enterobacter sakazakii - MIC*    CEFAZOLIN 16 RESISTANT  Resistant     CEFEPIME <=1 SENSITIVE Sensitive     CEFTAZIDIME <=1 SENSITIVE Sensitive     CEFTRIAXONE <=1 SENSITIVE Sensitive     CIPROFLOXACIN <=0.25 SENSITIVE Sensitive     GENTAMICIN <=1 SENSITIVE Sensitive     IMIPENEM <=0.25 SENSITIVE Sensitive     TRIMETH/SULFA <=20 SENSITIVE Sensitive     PIP/TAZO <=4 SENSITIVE Sensitive     * ENTEROBACTER SAKAZAKII  Blood Culture (routine x 2)     Status: None   Collection Time: 01/05/15  5:48 PM  Result Value Ref Range Status   Specimen Description BLOOD LEFT FOREARM  Final   Special Requests BOTTLES DRAWN AEROBIC AND ANAEROBIC 5CC  Final   Culture  Setup Time   Final    GRAM NEGATIVE RODS IN BOTH AEROBIC AND ANAEROBIC BOTTLES CRITICAL RESULT CALLED TO, READ BACK BY AND VERIFIED WITH: K PHILLIPS,RN AT [facility] 01/06/15 BY L BENFIELD    Culture   Final    ENTEROBACTER SAKAZAKII Performed at [facility]    Report Status 01/08/2015 FINAL  Final  Urine culture     Status: None   Collection Time: 01/05/15  7:02 PM  Result Value Ref Range Status   Specimen Description URINE, CLEAN CATCH  Final   Special Requests NONE  Final   Culture   Final    NO GROWTH 1 DAY Performed at [facility]    Report Status 01/06/2015 FINAL  Final     Labs: Basic Metabolic Panel:  Recent Labs Lab 01/08/15 0500 01/09/15 0430 01/10/15 0542 01/11/15 0523 01/12/15 0837  NA 140 138 140 139 139  K 3.6 3.3* 3.8 4.0 4.0  CL 111 108 115* 112* 113*  CO2 21* 19* 19* 16* 16*  GLUCOSE 91 98 105* 120* 96  BUN 26* 24* 25* 26* 30*  CREATININE 1.49* 1.59* 1.94* 2.14* 2.24*  CALCIUM 7.7* 7.8* 8.0* 8.4* 8.1*  MG  --  2.3  --   --   --    Liver Function Tests:  Recent Labs Lab 01/05/15 1717 01/06/15 0555 01/09/15 0430 01/10/15 0542  AST 126* 114* 96* 70*  ALT 129* 116* 90* 60  ALKPHOS 453* 368* 443* 313*  BILITOT 2.1* 2.0* 1.7* 1.8*  PROT 6.0* 5.7* 5.7* 5.7*  ALBUMIN 2.4* 2.1* 2.0* 3.3*   No results for input(s): LIPASE, AMYLASE in the  last 168 hours. No results for input(s): AMMONIA in the last 168 hours. CBC:  Recent Labs Lab 01/05/15 1717 01/06/15 0555  01/09/15 0430 01/10/15 0542 01/10/15 1533 01/11/15 0523 01/12/15 0837  WBC 16.4* 12.5*  < > 11.0* 10.0 11.4* 15.6* 18.3*  NEUTROABS 15.3* 9.8*  --  8.6*  --   --   --   --   HGB 9.3* 8.3*  < > 8.4* 7.1* 7.1* 10.7* 11.1*  HCT 28.9* 26.0*  < > 26.4* 22.1* 22.3* 32.8* 33.9*  MCV 90.6 90.6  < > 88.9 88.4 87.1 87.7 88.7  PLT 138* 120*  < > 221 103* 112* 129* 139*  < > = values in this interval not displayed. Cardiac Enzymes:  Recent Labs Lab 01/09/15 0430  TROPONINI 0.03   BNP: BNP (last 3 results) No results for input(s): BNP in the last 8760 hours.  ProBNP (last 3 results) No results for input(s): PROBNP in the last 8760 hours.  CBG: No results for input(s): GLUCAP in the last 168 hours.     SignedKelvin Cellar  [facility] 01/12/2015, 10:39 AM

## 2015-01-12 NOTE — Clinical Documentation Improvement (Signed)
Internal Medicine  Can the diagnosis of anemia be further specified in progress notes and discharge summary?   Iron deficiency Anemia  Nutritional anemia, including the nutrition or mineral deficits  Chronic Anemia, including the suspected or known cause  Anemia of chronic disease, including the associated chronic disease state  Other  Clinically Undetermined  Document any associated diagnoses/conditions.   Supporting Information:  10/11 progress note: 10-symptomatic anemia: associated to malignancy and potential myelosuppression reaction to use of cipro.  -will transfuse 2 units of PRBC's -check for FOBT -follow Hgb trend   10/12 Progress note: On 10/11 order to be transfused 2 units of PRBC's was placed and will follow response/tolerance to bactrim. 10-symptomatic anemia: associated to malignancy and potential myelosuppression reaction to use of cipro.  -He was transfused 2 units of packed red blood cells on 01/10/2015 with repeat labs showing hemoglobin of 10.7, improved from 7.1  Component     Latest Ref Rng 01/05/2015 01/06/2015 01/07/2015 01/08/2015 01/09/2015              Hemoglobin     13.0 - 17.0 g/dL 9.3 (L) 8.3 (L) 8.0 (L) 8.0 (L) 8.4 (L)  HCT     39.0 - 52.0 % 28.9 (L) 26.0 (L) 24.9 (L) 25.0 (L) 26.4 (L)   Component     Latest Ref Rng 01/10/2015 01/10/2015 01/11/2015 01/12/2015         5:42 AM  3:33 PM    Hemoglobin     13.0 - 17.0 g/dL 7.1 (L) 7.1 (L) 10.7 (L) 11.1 (L)  HCT     39.0 - 52.0 % 22.1 (L) 22.3 (L) 32.8 (L) 33.9 (L)   Please exercise your independent, professional judgment when responding. A specific answer is not anticipated or expected.   Thank You,  [place] [phone]

## 2015-01-12 NOTE — Progress Notes (Signed)
Peripherally Inserted Central Catheter/Midline Placement  The IV Nurse has discussed with the patient and/or persons authorized to consent for the patient, the purpose of this procedure and the potential benefits and risks involved with this procedure.  The benefits include less needle sticks, lab draws from the catheter and patient may be discharged home with the catheter.  Risks include, but not limited to, infection, bleeding, blood clot (thrombus formation), and puncture of an artery; nerve damage and irregular heat beat.  Alternatives to this procedure were also discussed.  PICC/Midline Placement Documentation  PICC / Midline Single Lumen 01/12/15 PICC Right Brachial 45 cm 3 cm (Active)  Indication for Insertion or Continuance of Line Home intravenous therapies (PICC only) 01/12/2015 10:00 AM  Exposed Catheter (cm) 3 cm 01/12/2015 10:00 AM  Dressing Change Due 01/19/15 01/12/2015 10:00 AM       Jule Economy Horton 01/12/2015, 10:31 AM

## 2015-01-12 NOTE — Progress Notes (Signed)
Daily Progress Note   Patient Name: Terrance Larsen       Date: 01/12/2015 DOB: 1945/11/30  Age: 69 y.o. MRN#: [id number] Attending Physician: Kelvin Cellar, MD Primary Care Physician: Cari Caraway, MD Admit Date: 01/05/2015  Reason for Consultation/Follow-up: Establishing goals of care and Non pain symptom management  Subjective:     I met again with Mr. & Mrs. Liew and they are in much better spirits. He says that his symptoms are much better and he has been able to eat a small amount. Very excited to be going home after PICC placed. They are very appreciative of the care they have received and seem much less stressed today than when I met them yesterday. Very excited to go home. I again expressed my concern that these symptoms are related to his cancer and concern of how he would tolerate chemotherapy if he cannot tolerate po antibiotics.    Length of Stay: 7 days  Current Medications: Scheduled Meds:  . antiseptic oral rinse  7 mL Mouth Rinse q12n4p  . budesonide (PULMICORT) nebulizer solution  0.25 mg Nebulization BID  . cefTRIAXone (ROCEPHIN)  IV  2 g Intravenous Q24H  . chlorhexidine  15 mL Mouth Rinse BID  . clotrimazole  1 application Topical BID  . enoxaparin  85 mg Subcutaneous BID  . feeding supplement  1 Container Oral BID BM  . metoCLOPramide (REGLAN) injection  5 mg Intravenous 3 times per day  . montelukast  10 mg Oral Daily  . ondansetron  4 mg Oral TID  . sodium chloride  3 mL Intravenous Q12H    Continuous Infusions:    PRN Meds: ipratropium-albuterol, polyethylene glycol, prochlorperazine, simethicone  Palliative Performance Scale: 30%     Vital Signs: BP 139/82 mmHg  Pulse 89  Temp(Src) 98.7 F (37.1 C) (Oral)  Resp 18  Ht _0  (1.778 m)  Wt 84 kg (185 lb 3 oz)  BMI 26.57 kg/m2  SpO2 96% SpO2: SpO2: 96 % O2 Device: O2 Device: Not Delivered O2 Flow Rate: O2 Flow Rate (L/min): 2 L/min  Intake/output summary:  Intake/Output Summary  (Last 24 hours) at 01/12/15 0959 Last data filed at 01/11/15 1616  Gross per 24 hour  Intake     50 ml  Output      0 ml  Net     50 ml   LBM: Last BM Date: 01/11/15 Baseline Weight: Weight: 82.101 kg (181 lb) Most recent weight: Weight: 84 kg (185 lb 3 oz)  Physical Exam: General: Appears exhausted, dry heaving Resp: No labored breathing Abd: Slightly distended Neuro: Awake, alert, oriented x 3    Additional Data Reviewed: Recent Labs     01/11/15  0523  01/12/15  0837  WBC  15.6*  18.3*  HGB  10.7*  11.1*  PLT  129*  139*  NA  139  139  BUN  26*  30*  CREATININE  2.14*  2.24*     Problem List:  Patient Active Problem List   Diagnosis Date Noted  . Palliative care encounter   . Abdominal distension   . Ascites   . Constipation   . Encounter for palliative care   . Bacteremia   . AKI (acute kidney injury) ([place])   . Protein-calorie malnutrition, severe 01/06/2015  . HCAP (healthcare-associated pneumonia) 01/05/2015  . Anemia 01/05/2015  . Bilateral pulmonary embolism ([place]) 01/05/2015  . Cholangiocarcinoma ([place]) 01/05/2015  . Asthma 01/05/2015  . Hypertension 01/05/2015     Palliative Care  Assessment & Plan    Code Status:  DNR  Goals of Care:  They want to pursue palliative chemotherapy and plan f/u with onc at [facility] on Monday appt.  3. Symptom Management:  Nausea/poor appetite: Transition antibiotics to IV. Zofran ODT TID. This is much improved but not eradicated. Still believe much of this is cancer related.   Constipation: resolved  Ascites: Discussed benefit/use of peritoneal drain placement with recurrent ascites.  4. Prognosis: < 6 months.   5. Discharge Planning: Home with home health to manage IV antibiotics (not tolerating po). Recommend f/u with hospice support - did not discuss hospice today. Planning f/u with onc for chemotherapy options.   Thank you for allowing the Palliative Medicine Team to assist in the care of this  patient.   Time In: 0850 Time Out: 0920 Total Time 21mn Prolonged Time Billed  no     Greater than 50%  of this time was spent counseling and coordinating care related to the above assessment and plan.     AVinie Sill NP Palliative Medicine Team Pager # [phone](M-F 8a-5p) Team Phone # [phone](Nights/Weekends)  01/12/2015, 9:59 AM

## 2015-01-18 NOTE — Progress Notes (Signed)
Home health RN Seth Bake) called [phone] regarding picc line discontinuation. Seth Bake reported patient finished home iv abx, he is suppose to follow up with oncology on Friday 10/21. Advise to d/c picc line if ok with oncology, Seth Bake states will contact oncology.

## 2015-02-03 ENCOUNTER — Other Ambulatory Visit (HOSPITAL_COMMUNITY): Payer: Self-pay | Admitting: Hematology and Oncology

## 2015-02-03 DIAGNOSIS — R188 Other ascites: Secondary | ICD-10-CM

## 2015-02-03 DIAGNOSIS — C221 Intrahepatic bile duct carcinoma: Secondary | ICD-10-CM

## 2015-02-06 ENCOUNTER — Telehealth (HOSPITAL_COMMUNITY): Payer: Self-pay

## 2015-02-08 ENCOUNTER — Ambulatory Visit (HOSPITAL_COMMUNITY)
Admission: RE | Admit: 2015-02-08 | Discharge: 2015-02-08 | Disposition: A | Discharge status: Home / Self Care | Payer: Federal, State, Local not specified - PPO | Source: Ambulatory Visit | Attending: Hematology and Oncology | Admitting: Hematology and Oncology

## 2015-02-08 DIAGNOSIS — C221 Intrahepatic bile duct carcinoma: Secondary | ICD-10-CM | POA: Diagnosis not present

## 2015-02-08 DIAGNOSIS — R188 Other ascites: Secondary | ICD-10-CM

## 2015-02-08 NOTE — Procedures (Signed)
Successful US guided paracentesis from LLQ.  Yielded 4.6 liters of serous fluid.  No immediate complications.  Pt tolerated well.   Specimen was not sent for labs.  Tsosie Billing D PA-C 02/08/2015 10:56 AM

## 2015-02-09 DIAGNOSIS — R112 Nausea with vomiting, unspecified: Secondary | ICD-10-CM | POA: Insufficient documentation

## 2015-03-02 DEATH — deceased

## 2016-08-24 IMAGING — US US PARACENTESIS
1 series · 8 of 8 positions shown · non-contrast
Comparison: CT Abdomen/Pelvis 01/08/2015.

MEDICATIONS:
None.

COMPLICATIONS:
None immediate

INDICATION: Ascites, request for therapeutic paracentesis.

EXAM:
ULTRASOUND-GUIDED PARACENTESIS
TECHNIQUE: Informed written consent was obtained from the patient after a
discussion of the risks, benefits and alternatives to treatment. A
timeout was performed prior to the initiation of the procedure.

[Series 1: us paracentesis · 0.27mm/px · 8 of 8 slices shown]
[im 1/8]
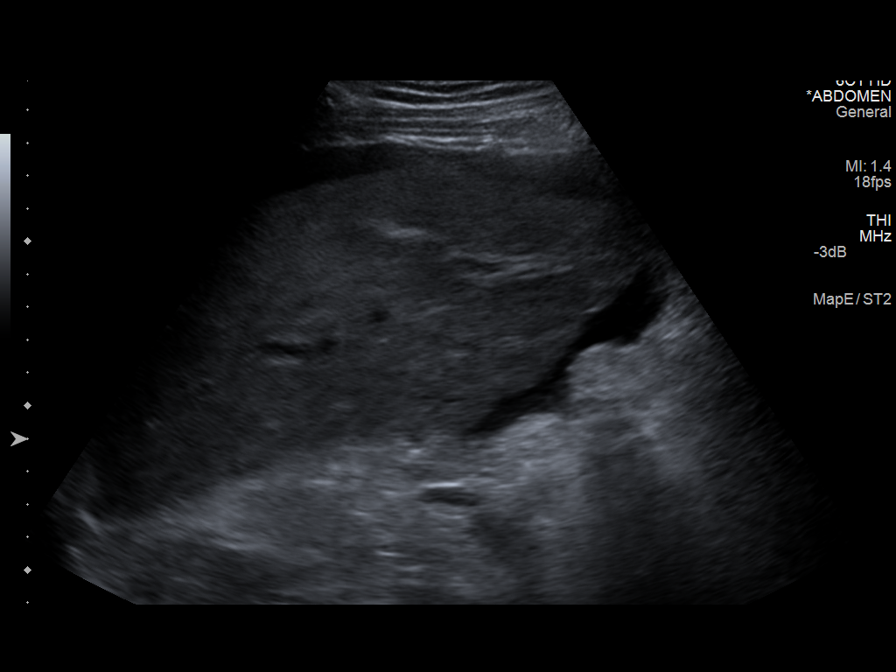
[im 2/8]
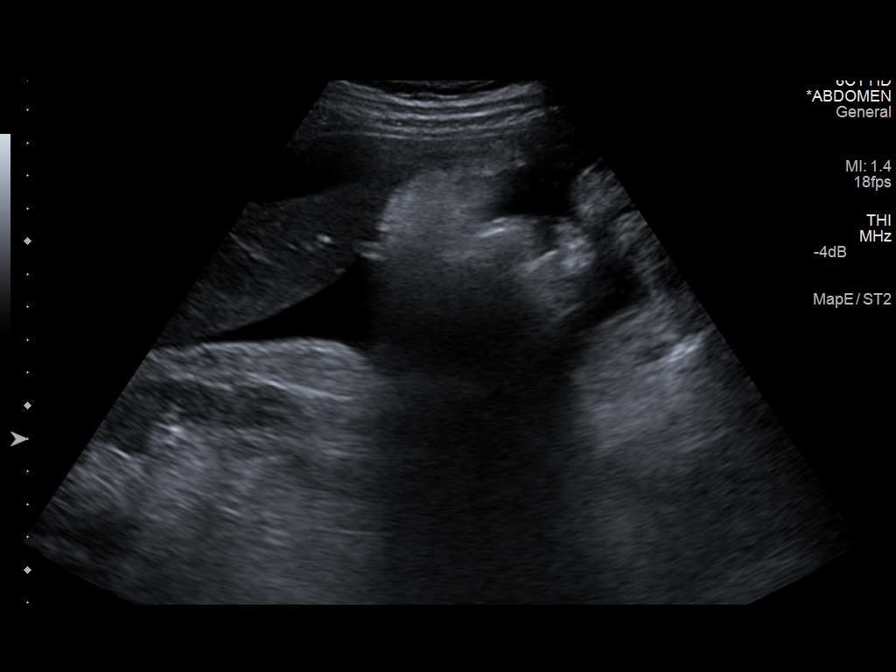
[im 3/8]
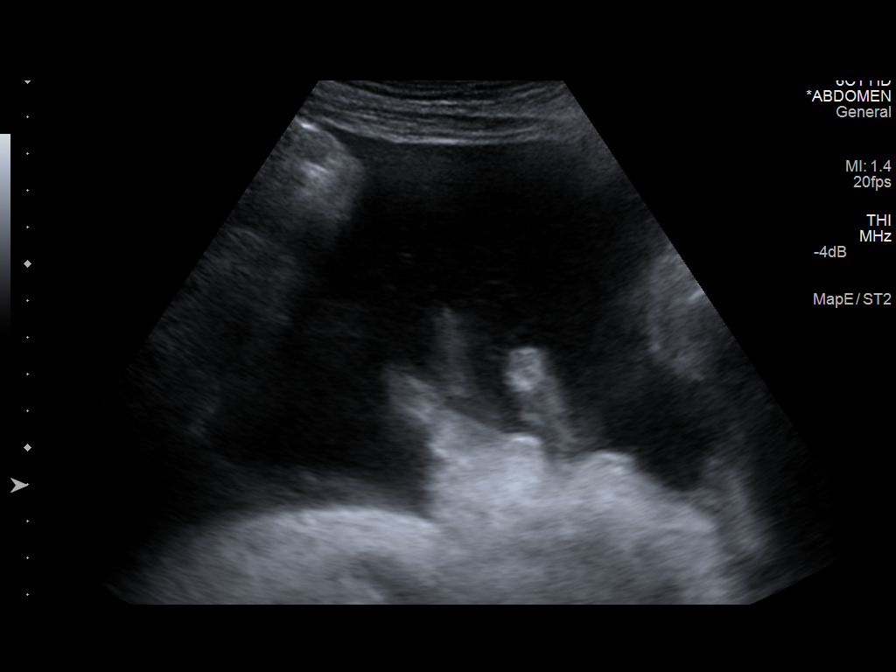
[im 4/8]
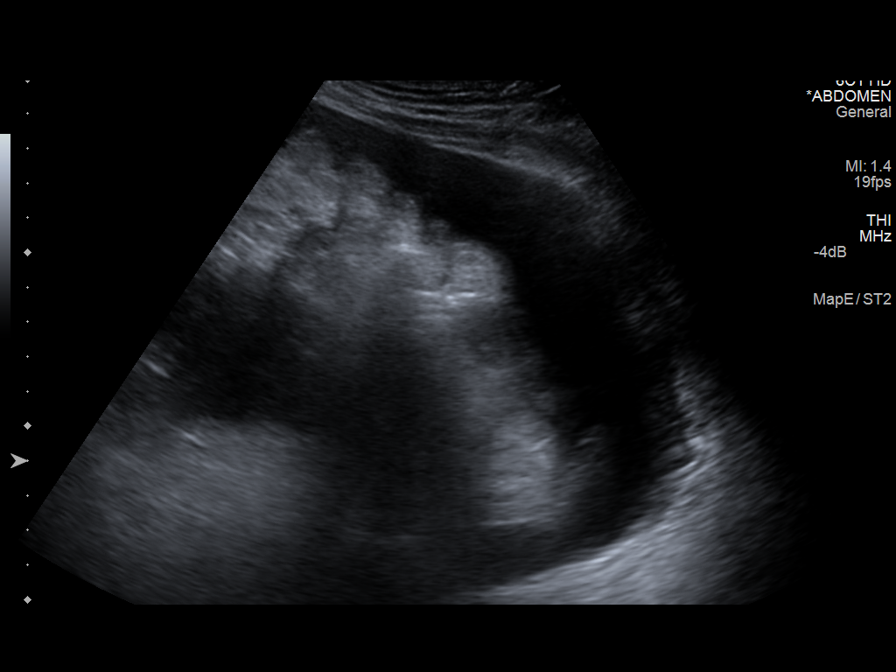
[im 5/8]
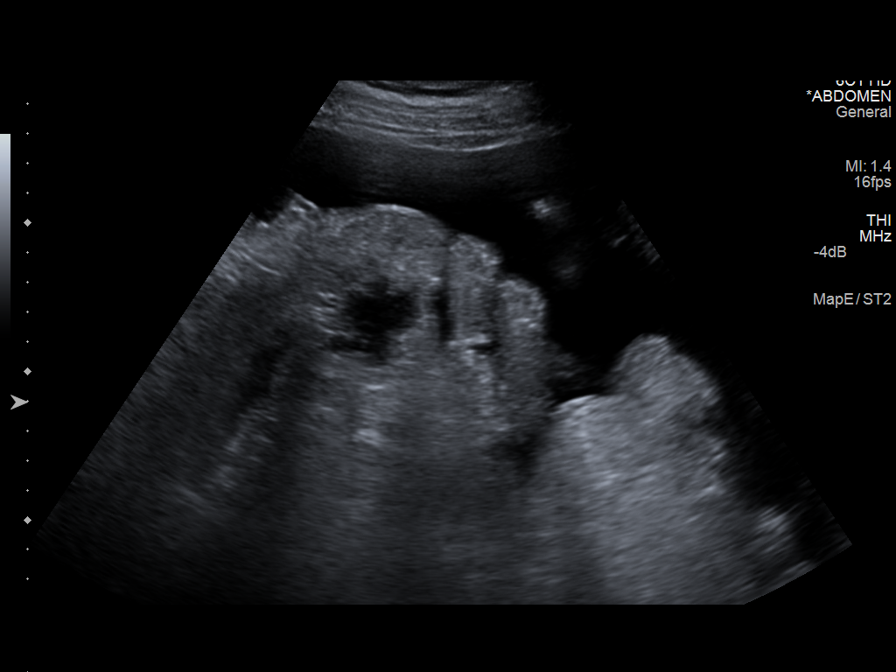
[im 6/8]
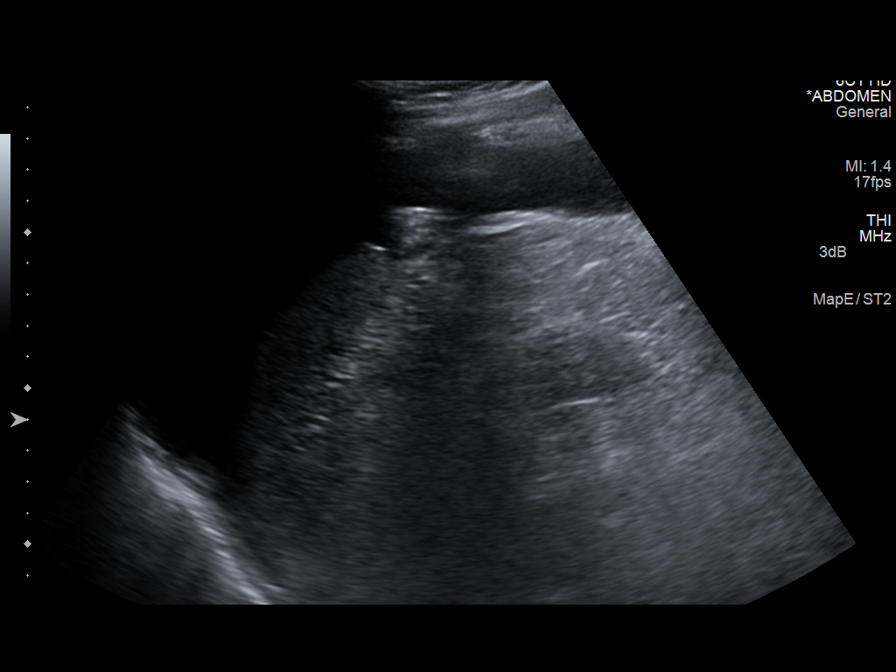
[im 7/8]
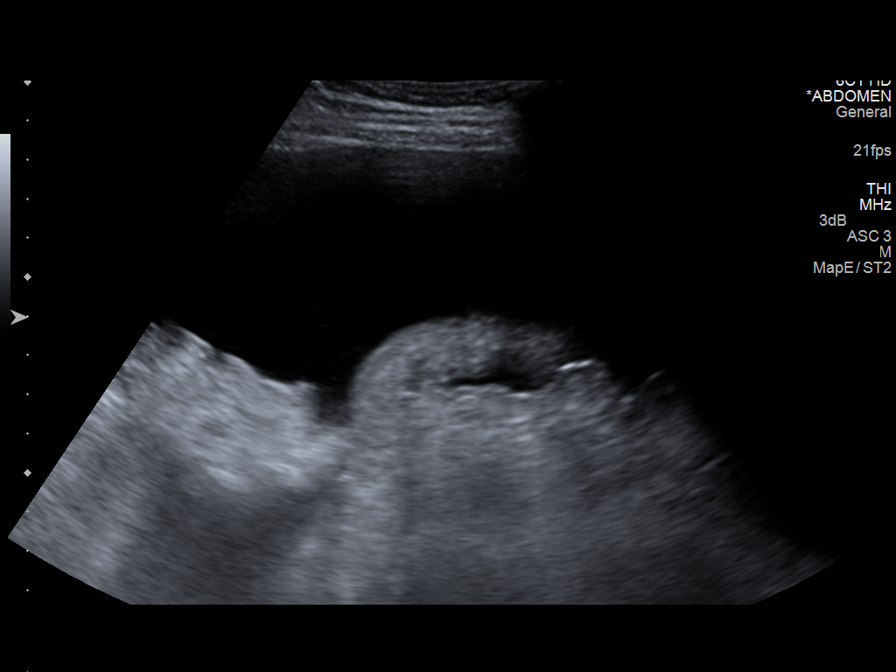
[im 8/8]
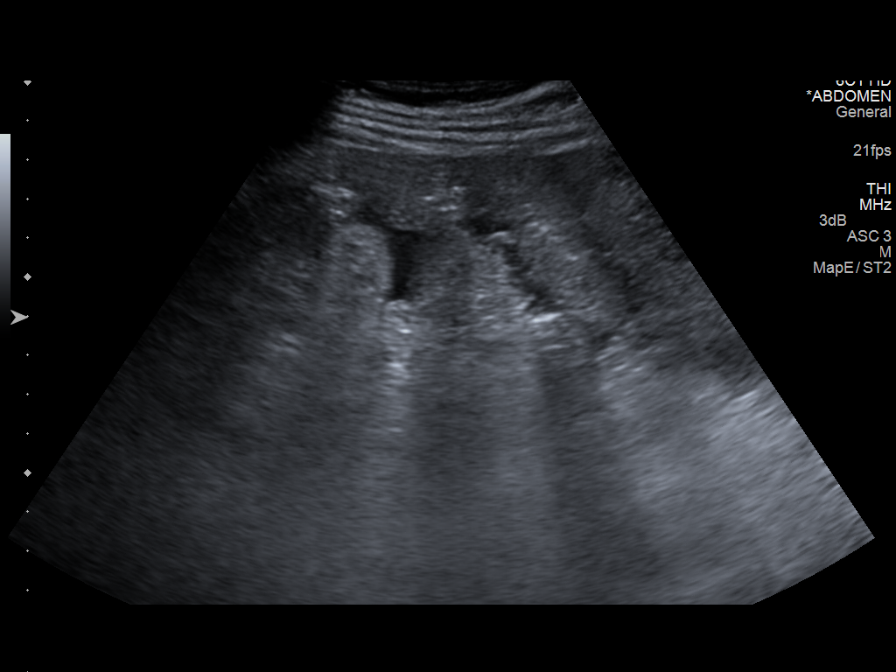

[8 of 8 positions shown; findings below may reference images not displayed]

Initial ultrasound scanning demonstrates a large amount of ascites
within the left lower abdominal quadrant. The left lower abdomen was
prepped and draped in the usual sterile fashion. 1% lidocaine was
used for local anesthesia.

Under direct ultrasound guidance, a 19 gauge, 7-cm, Yueh catheter
was introduced. An ultrasound image was saved for documentation
purposed. The paracentesis was performed. The catheter was removed
and a dressing was applied. The patient tolerated the procedure well
without immediate post procedural complication.
FINDINGS: A total of approximately 3.8 liters of serous fluid was removed.
IMPRESSION: Successful ultrasound-guided paracentesis yielding 3.8 liters of
peritoneal fluid.

## 2016-09-23 IMAGING — US US PARACENTESIS
1 series · 7 of 7 positions shown · non-contrast
Comparison: Paracentesis 01/09/2015.

MEDICATIONS:
None.

COMPLICATIONS:
None immediate

INDICATION: Cholangiocarcinoma, ascites and request for paracentesis.

EXAM:
ULTRASOUND-GUIDED PARACENTESIS
TECHNIQUE: Informed written consent was obtained from the patient after a
discussion of the risks, benefits and alternatives to treatment. A
timeout was performed prior to the initiation of the procedure.

[Series 1: us paracentesis · 0.24mm/px · 7 of 7 slices shown]
[im 1/7]
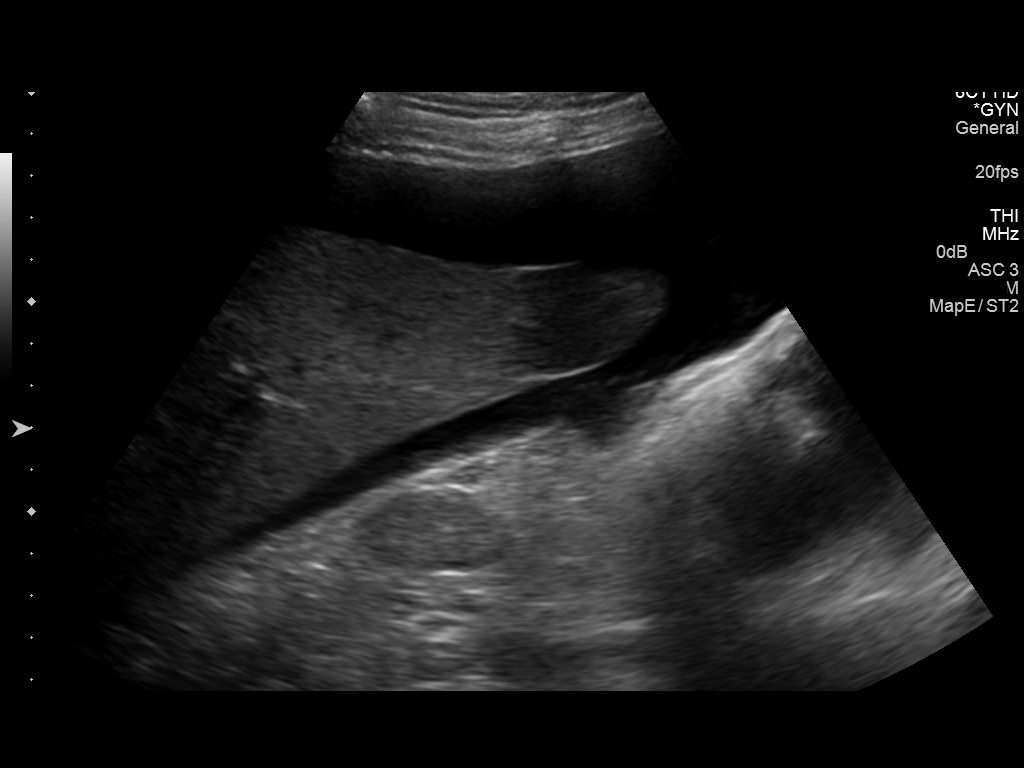
[im 2/7]
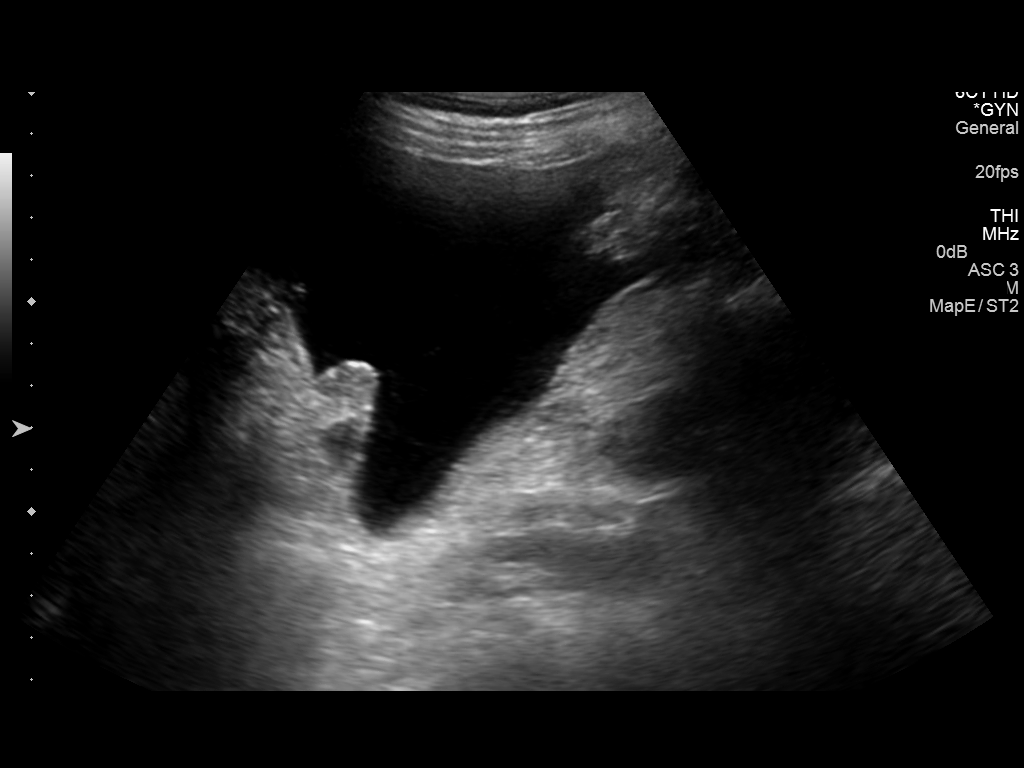
[im 3/7]
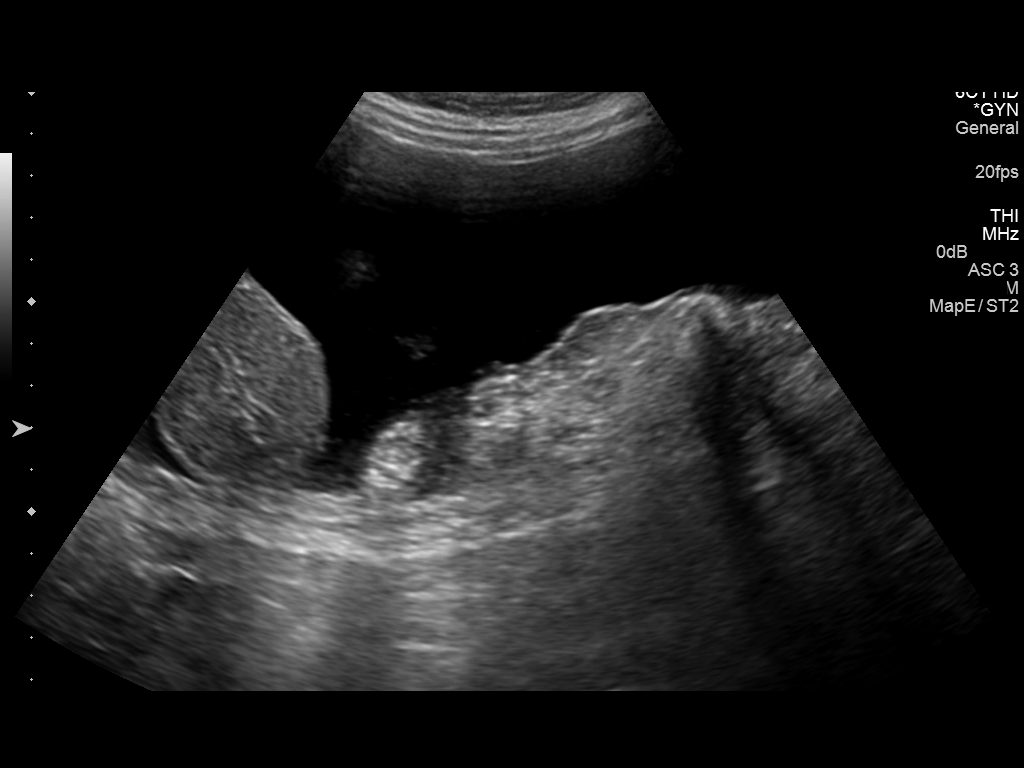
[im 4/7]
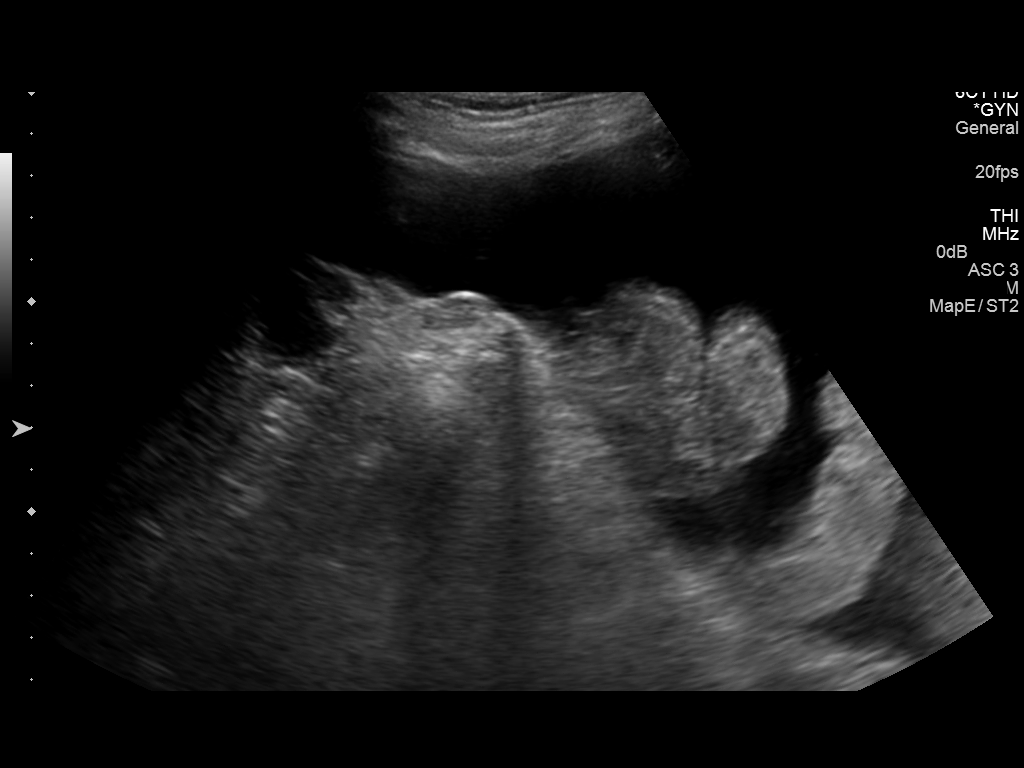
[im 5/7]
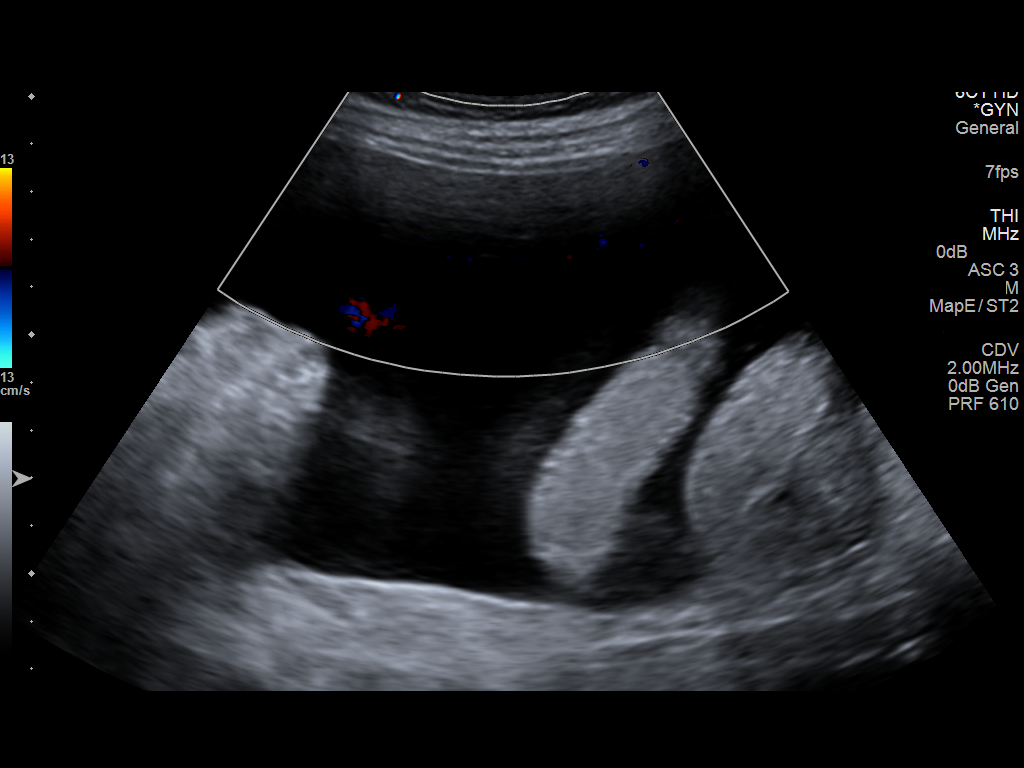
[im 6/7]
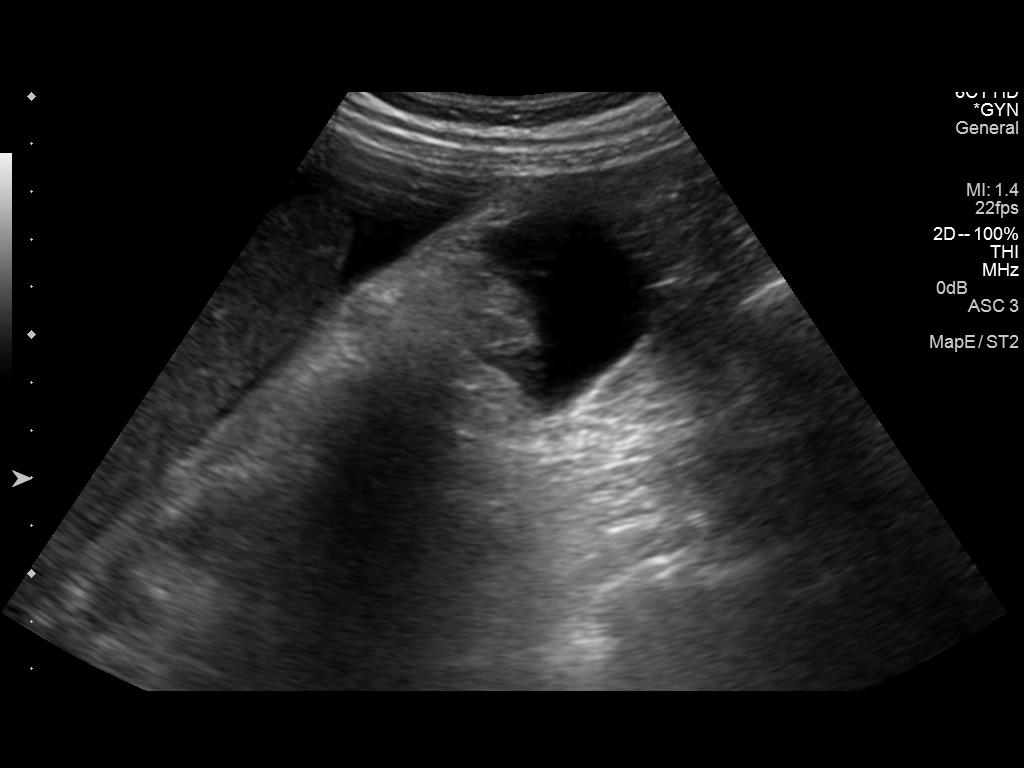
[im 7/7]
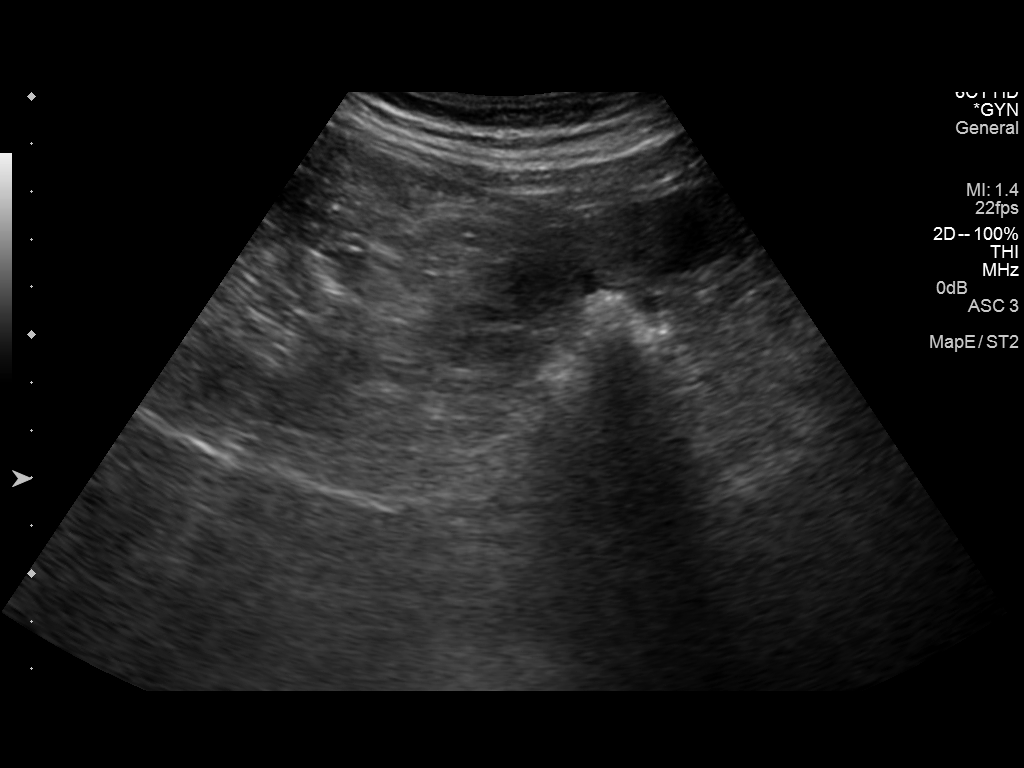

[7 of 7 positions shown; findings below may reference images not displayed]

Initial ultrasound scanning demonstrates a large amount of ascites
within the left lower abdominal quadrant. The left lower abdomen was
prepped and draped in the usual sterile fashion. 1% lidocaine was
used for local anesthesia.

Under direct ultrasound guidance, a 19 gauge, 7-cm, Yueh catheter
was introduced. An ultrasound image was saved for documentation
purposed. The paracentesis was performed. The catheter was removed
and a dressing was applied. The patient tolerated the procedure well
without immediate post procedural complication.
FINDINGS: A total of approximately 4.6 liters of serous fluid was removed.
IMPRESSION: Successful ultrasound-guided paracentesis yielding 4.6 liters of
peritoneal fluid.
